# Patient Record
Sex: Male | Born: 1960 | Race: White | Hispanic: No | Marital: Married | Smoking: Former smoker
Health system: Southern US, Community
[De-identification: ages and names within clinical notes are randomized; demographics above are authoritative.]

## PROBLEM LIST (undated history)

## (undated) DIAGNOSIS — F419 Anxiety disorder, unspecified: Secondary | ICD-10-CM

## (undated) DIAGNOSIS — M65351 Trigger finger, right little finger: Secondary | ICD-10-CM

## (undated) DIAGNOSIS — F32A Depression, unspecified: Secondary | ICD-10-CM

## (undated) DIAGNOSIS — K589 Irritable bowel syndrome without diarrhea: Secondary | ICD-10-CM

## (undated) DIAGNOSIS — E785 Hyperlipidemia, unspecified: Secondary | ICD-10-CM

## (undated) DIAGNOSIS — I10 Essential (primary) hypertension: Secondary | ICD-10-CM

## (undated) DIAGNOSIS — E119 Type 2 diabetes mellitus without complications: Secondary | ICD-10-CM

## (undated) DIAGNOSIS — F1020 Alcohol dependence, uncomplicated: Secondary | ICD-10-CM

## (undated) DIAGNOSIS — F329 Major depressive disorder, single episode, unspecified: Secondary | ICD-10-CM

## (undated) DIAGNOSIS — K5792 Diverticulitis of intestine, part unspecified, without perforation or abscess without bleeding: Secondary | ICD-10-CM

## (undated) DIAGNOSIS — M65322 Trigger finger, left index finger: Secondary | ICD-10-CM

## (undated) DIAGNOSIS — T7840XA Allergy, unspecified, initial encounter: Secondary | ICD-10-CM

## (undated) DIAGNOSIS — K219 Gastro-esophageal reflux disease without esophagitis: Secondary | ICD-10-CM

## (undated) HISTORY — DX: Hyperlipidemia, unspecified: E78.5

## (undated) HISTORY — DX: Gastro-esophageal reflux disease without esophagitis: K21.9

## (undated) HISTORY — DX: Anxiety disorder, unspecified: F41.9

## (undated) HISTORY — PX: INGUINAL HERNIA REPAIR: SUR1180

## (undated) HISTORY — DX: Essential (primary) hypertension: I10

## (undated) HISTORY — DX: Allergy, unspecified, initial encounter: T78.40XA

## (undated) HISTORY — DX: Alcohol dependence, uncomplicated: F10.20

## (undated) HISTORY — DX: Type 2 diabetes mellitus without complications: E11.9

## (undated) HISTORY — DX: Major depressive disorder, single episode, unspecified: F32.9

## (undated) HISTORY — DX: Depression, unspecified: F32.A

---

## 2011-11-23 ENCOUNTER — Ambulatory Visit (INDEPENDENT_AMBULATORY_CARE_PROVIDER_SITE_OTHER): Payer: BC Managed Care – PPO | Admitting: Family Medicine

## 2011-11-23 ENCOUNTER — Encounter: Payer: Self-pay | Admitting: Family Medicine

## 2011-11-23 VITALS — BP 152/89 | HR 74 | Temp 98.6°F | Resp 16 | Ht 71.0 in | Wt 254.0 lb

## 2011-11-23 DIAGNOSIS — R030 Elevated blood-pressure reading, without diagnosis of hypertension: Secondary | ICD-10-CM

## 2011-11-23 DIAGNOSIS — E669 Obesity, unspecified: Secondary | ICD-10-CM | POA: Insufficient documentation

## 2011-11-23 DIAGNOSIS — J309 Allergic rhinitis, unspecified: Secondary | ICD-10-CM | POA: Insufficient documentation

## 2011-11-23 NOTE — Progress Notes (Signed)
  Subjective:    Patient ID: Kyle Price, male    DOB: [birth year], 51 y.o.   MRN: [id number]  HPI  This 52 y.o. 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 His only complaint is an mild HA in the afternoon.  He has a Strong Family Hx for CAD; both his father and his brother had Myocardial Infarctions.  He and his wife had made some lifestyle changes in order to lose weight and to help him reduce his  risk factors for CAD.   Review of Systems  Constitutional: Negative.   Respiratory: Negative.   Cardiovascular: Negative.   Neurological: Positive for headaches. Negative for dizziness, syncope, weakness, light-headedness and numbness.  Psychiatric/Behavioral: Negative.        Objective:   Physical Exam  Nursing note and vitals reviewed. Constitutional: He is oriented to person, place, and time. He appears well-developed and well-nourished. No distress.  HENT:  Head: Normocephalic and atraumatic.  Eyes: EOM are normal. No scleral icterus.  Cardiovascular: Normal rate.   Pulmonary/Chest: Effort normal. No respiratory distress.  Musculoskeletal: He exhibits no edema.  Neurological: He is alert and oriented to person, place, and time. No cranial nerve deficit.  Psychiatric: He has a normal mood and affect. His behavior is normal.          Assessment & Plan:   1. Elevated blood-pressure reading without diagnosis of hypertension     ContinueTLCs for weight reduction and RTC in 1 month   2. Obesity (BMI 30-39.9)

## 2011-11-23 NOTE — Patient Instructions (Signed)
Obesity Obesity is defined as having a body mass index (BMI) of 30 or more. To calculate your BMI divide your weight in pounds by your height in inches squared and multiply that product by 703. Major illnesses resulting from long-term obesity include:  Stroke.   Heart disease.   Diabetes.   Many cancers.   Arthritis.  Obesity also complicates recovery from many other medical problems.  CAUSES   A history of obesity in your parents.   Thyroid hormone imbalance.   Environmental factors such as excess calorie intake and physical inactivity.  TREATMENT  A healthy weight loss program includes:  A calorie restricted diet based on individual calorie needs.   Increased physical activity (exercise).  An exercise program is just as important as the right low-calorie diet.  Weight-loss medicines should be used only under the supervision of your physician. These medicines help, but only if they are used with diet and exercise programs. Medicines can have side effects including nervousness, nausea, abdominal pain, diarrhea, headache, drowsiness, and depression.  An unhealthy weight loss program includes:  Fasting.   Fad diets.   Supplements and drugs.  These choices do not succeed in long-term weight control.  HOME CARE INSTRUCTIONS  To help you make the needed dietary changes:   Exercise and perform physical activity as directed by your caregiver.   Keep a daily record of everything you eat. There are many free websites to help you with this. It may be helpful to measure your foods so you can determine if you are eating the correct portion sizes.   Use low-calorie cookbooks or take special cooking classes.   Avoid alcohol. Drink more water and drinks with no calories.   Take vitamins and supplements only as recommended by your caregiver.   Weight loss support groups, Registered Dieticians, counselors, and stress reduction education can also be very helpful.  Document Released:  10/06/2004 Document Revised: 08/18/2011 Document Reviewed: 08/05/2007 [employer] Patient Information 2012 [employer]. 

## 2012-01-04 ENCOUNTER — Encounter: Payer: Self-pay | Admitting: Family Medicine

## 2012-01-04 ENCOUNTER — Ambulatory Visit (INDEPENDENT_AMBULATORY_CARE_PROVIDER_SITE_OTHER): Payer: BC Managed Care – PPO | Admitting: Family Medicine

## 2012-01-04 VITALS — BP 149/92 | HR 86 | Temp 97.6°F | Resp 16 | Ht 70.0 in | Wt 258.0 lb

## 2012-01-04 DIAGNOSIS — Z8249 Family history of ischemic heart disease and other diseases of the circulatory system: Secondary | ICD-10-CM

## 2012-01-04 DIAGNOSIS — E669 Obesity, unspecified: Secondary | ICD-10-CM

## 2012-01-04 DIAGNOSIS — IMO0001 Reserved for inherently not codable concepts without codable children: Secondary | ICD-10-CM

## 2012-01-04 DIAGNOSIS — R030 Elevated blood-pressure reading, without diagnosis of hypertension: Secondary | ICD-10-CM

## 2012-01-04 NOTE — Progress Notes (Signed)
  Subjective:    Patient ID: Kyle Price, male    DOB: [birth year], 51 y.o.   MRN: [id number]  HPI This pleasant 51 y.o. Cauc male returns for BP check as he has had several elevated readings  as high as 180/102 back in February when he was here for DOT exam. Repeat readings are always lower.  Pt is aware that he needs to reduce his weight and had used Weight Watchers in the past. 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 This AM, he had a little stress at home  trying to get his 29 y.o.son up for school.   Review of Systems  Constitutional: Negative.   Respiratory: Positive for shortness of breath. Negative for cough and chest tightness.        Minimal SOB with activity due to "being out of shape"  Cardiovascular: Negative for chest pain and palpitations.  Neurological: Negative for dizziness, syncope, light-headedness and headaches.       Objective:   Physical Exam  Nursing note and vitals reviewed. Constitutional: He is oriented to person, place, and time. He appears well-developed and well-nourished. No distress.  HENT:  Head: Normocephalic and atraumatic.  Eyes: EOM are normal. No scleral icterus.  Cardiovascular: Normal rate, regular rhythm and normal heart sounds.  Exam reveals no gallop and no friction rub.   No murmur heard. Pulmonary/Chest: Effort normal and breath sounds normal. No respiratory distress.  Abdominal:       Obese  Neurological: He is alert and oriented to person, place, and time. No cranial nerve deficit.  Psychiatric: He has a normal mood and affect. His behavior is normal.    ECG: NSR; no ST-T wave abnormalities      Assessment & Plan:   1. HTN, white coat  Monitor and encourage healthy lifestyle changes Re-check in 6 weeks and 3 months  2. Obesity (BMI 35.0-39.9 without comorbidity)

## 2012-01-04 NOTE — Patient Instructions (Signed)
Your blood pressure was elevated today and you may have "White coat" hypertension. We will monitor your BP; return to office in 6 weeks and 3 months. Begin healthy nutrition changes and increase your activity level.

## 2012-02-15 ENCOUNTER — Ambulatory Visit: Payer: BC Managed Care – PPO | Admitting: Family Medicine

## 2012-02-24 ENCOUNTER — Ambulatory Visit: Payer: BC Managed Care – PPO | Admitting: Family Medicine

## 2012-03-02 ENCOUNTER — Ambulatory Visit: Payer: BC Managed Care – PPO | Admitting: Family Medicine

## 2012-04-10 ENCOUNTER — Ambulatory Visit (INDEPENDENT_AMBULATORY_CARE_PROVIDER_SITE_OTHER): Payer: BC Managed Care – PPO | Admitting: Family Medicine

## 2012-04-10 VITALS — BP 152/82 | HR 84 | Temp 98.7°F | Resp 16 | Ht 71.0 in | Wt 256.0 lb

## 2012-04-10 DIAGNOSIS — K589 Irritable bowel syndrome without diarrhea: Secondary | ICD-10-CM

## 2012-04-10 DIAGNOSIS — R300 Dysuria: Secondary | ICD-10-CM

## 2012-04-10 DIAGNOSIS — N390 Urinary tract infection, site not specified: Secondary | ICD-10-CM

## 2012-04-10 DIAGNOSIS — K5900 Constipation, unspecified: Secondary | ICD-10-CM

## 2012-04-10 LAB — POCT CBC
HCT, POC: 47.9 % (ref 43.5–53.7)
Hemoglobin: 15.9 g/dL (ref 14.1–18.1)
MCH, POC: 30.3 pg (ref 27–31.2)
MPV: 8.9 fL (ref 0–99.8)
POC MID %: 5.4 %M (ref 0–12)
RBC: 5.24 M/uL (ref 4.69–6.13)
WBC: 12.3 10*3/uL — AB (ref 4.6–10.2)

## 2012-04-10 LAB — POCT UA - MICROSCOPIC ONLY
Casts, Ur, LPF, POC: NEGATIVE
Epithelial cells, urine per micros: NEGATIVE
Mucus, UA: NEGATIVE

## 2012-04-10 LAB — POCT URINALYSIS DIPSTICK
Bilirubin, UA: NEGATIVE
Glucose, UA: NEGATIVE
Spec Grav, UA: 1.025
Urobilinogen, UA: 0.2

## 2012-04-10 LAB — IFOBT (OCCULT BLOOD): IFOBT: NEGATIVE

## 2012-04-10 MED ORDER — CIPROFLOXACIN HCL 500 MG PO TABS
500.0000 mg | ORAL_TABLET | Freq: Two times a day (BID) | ORAL | Status: AC
Start: 2012-04-10 — End: 2012-04-20

## 2012-04-10 NOTE — Patient Instructions (Addendum)
Urinary Tract Infection Infections of the urinary tract can start in several places. A bladder infection (cystitis), a kidney infection (pyelonephritis), and a prostate infection (prostatitis) are different types of urinary tract infections (UTIs). They usually get better if treated with medicines (antibiotics) that kill germs. Take all the medicine until it is gone. You or your child may feel better in a few days, but TAKE ALL MEDICINE or the infection may not respond and may become more difficult to treat. HOME CARE INSTRUCTIONS   Drink enough water and fluids to keep the urine clear or pale yellow. Cranberry juice is especially recommended, in addition to large amounts of water.   Avoid caffeine, tea, and carbonated beverages. They tend to irritate the bladder.   Alcohol may irritate the prostate.   Only take over-the-counter or prescription medicines for pain, discomfort, or fever as directed by your caregiver.  To prevent further infections:  Empty the bladder often. Avoid holding urine for long periods of time.   After a bowel movement, women should cleanse from front to back. Use each tissue only once.   Empty the bladder before and after sexual intercourse.  FINDING OUT THE RESULTS OF YOUR TEST Not all test results are available during your visit. If your or your child's test results are not back during the visit, make an appointment with your caregiver to find out the results. Do not assume everything is normal if you have not heard from your caregiver or the medical facility. It is important for you to follow up on all test results. SEEK MEDICAL CARE IF:   There is back pain.   Your baby is older than 3 months with a rectal temperature of 100.5 F (38.1 C) or higher for more than 1 day.   Your or your child's problems (symptoms) are no better in 3 days. Return sooner if you or your child is getting worse.  SEEK IMMEDIATE MEDICAL CARE IF:   There is severe back pain or lower  abdominal pain.   You or your child develops chills.   You have a fever.   Your baby is older than 3 months with a rectal temperature of 102 F (38.9 C) or higher.   Your baby is 50 months old or younger with a rectal temperature of 100.4 F (38 C) or higher.   There is nausea or vomiting.   There is continued burning or discomfort with urination.  MAKE SURE YOU:   Understand these instructions.   Will watch your condition.   Will get help right away if you are not doing well or get worse.  Document Released: 06/08/2005 Document Revised: 08/18/2011 Document Reviewed: 01/11/2007 [facility] Patient Information 2012 [place], [place].   Constipation in Adults Constipation is having fewer than 2 bowel movements per week. Usually, the stools are hard. As we grow older, constipation is more common. If you try to fix constipation with laxatives, the problem may get worse. This is because laxatives taken over a long period of time make the colon muscles weaker. A low-fiber diet, not taking in enough fluids, and taking some medicines may make these problems worse. MEDICATIONS THAT MAY CAUSE CONSTIPATION  Water pills (diuretics).   Calcium channel blockers (used to control blood pressure and for the heart).   Certain pain medicines (narcotics).   Anticholinergics.   Anti-inflammatory agents.   Antacids that contain aluminum.  DISEASES THAT CONTRIBUTE TO CONSTIPATION  Diabetes.   Parkinson's disease.   Dementia.   Stroke.  Depression.   Illnesses that cause problems with salt and water metabolism.  HOME CARE INSTRUCTIONS   Constipation is usually best cared for without medicines. Increasing dietary fiber and eating more fruits and vegetables is the best way to manage constipation.   Slowly increase fiber intake to 25 to 38 grams per day. Whole grains, fruits, vegetables, and legumes are good sources of fiber. A dietitian can further help you incorporate high-fiber foods  into your diet.   Drink enough water and fluids to keep your urine clear or pale yellow.   A fiber supplement may be added to your diet if you cannot get enough fiber from foods.   Increasing your activities also helps improve regularity.   Suppositories, as suggested by your caregiver, will also help. If you are using antacids, such as aluminum or calcium containing products, it will be helpful to switch to products containing magnesium if your caregiver says it is okay.   If you have been given a liquid injection (enema) today, this is only a temporary measure. It should not be relied on for treatment of longstanding (chronic) constipation.   Stronger measures, such as magnesium sulfate, should be avoided if possible. This may cause uncontrollable diarrhea. Using magnesium sulfate may not allow you time to make it to the bathroom.  SEEK IMMEDIATE MEDICAL CARE IF:   There is bright red blood in the stool.   The constipation stays for more than 4 days.   There is belly (abdominal) or rectal pain.   You do not seem to be getting better.   You have any questions or concerns.  MAKE SURE YOU:   Understand these instructions.   Will watch your condition.   Will get help right away if you are not doing well or get worse.  Document Released: 05/27/2004 Document Revised: 08/18/2011 Document Reviewed: 08/02/2011 [facility] Patient Information 2012 [place], [place].

## 2012-04-10 NOTE — Progress Notes (Signed)
Subjective: 51 year old man who has been having problems recently. Last week he had several days of pain between his scapula his upper back, then a set subsequently subsided. He has history of chronic constipation, more in the recent year. He has had some pain in the last 24 hours when he attempts to urinate. He has not had that problem in the past except for a little mild dysuria when he was young. He is generally healthy. He is sedentary.  Objective: Overweight white male no acute distress. Chest clear. Heart regular without murmurs. Abdomen soft without masses. Has some mild generalized tenderness. No CVA tenderness. Normal male external genitalia with testes descended. Digital rectal exam reveals the prostate gland and the fairly average in size with no nodules palpable.  Assessment: Dysuria Constipation Recent back pains  Plan: CBC, PSA, urinalysis, hemosure  Results for orders placed in visit on 04/10/12  POCT CBC      Component Value Range   WBC 12.3 (*) 4.6 - 10.2 K/uL   Lymph, poc 1.5  0.6 - 3.4   POC LYMPH PERCENT 12.4  10 - 50 %L   MID (cbc) 0.7  0 - 0.9   POC MID % 5.4  0 - 12 %M   POC Granulocyte 10.1 (*) 2 - 6.9   Granulocyte percent 82.2 (*) 37 - 80 %G   RBC 5.24  4.69 - 6.13 M/uL   Hemoglobin 15.9  14.1 - 18.1 g/dL   HCT, POC 19.1  47.8 - 53.7 %   MCV 91.4  80 - 97 fL   MCH, POC 30.3  27 - 31.2 pg   MCHC 33.2  31.8 - 35.4 g/dL   RDW, POC 29.5     Platelet Count, POC 262  142 - 424 K/uL   MPV 8.9  0 - 99.8 fL  POCT UA - MICROSCOPIC ONLY      Component Value Range   WBC, Ur, HPF, POC 15-25     RBC, urine, microscopic 0-1     Bacteria, U Microscopic 4+     Mucus, UA neg     Epithelial cells, urine per micros neg     Crystals, Ur, HPF, POC neg     Casts, Ur, LPF, POC neg     Yeast, UA neg    POCT URINALYSIS DIPSTICK      Component Value Range   Color, UA yellow     Clarity, UA clear     Glucose, UA neg     Bilirubin, UA neg     Ketones, UA eng     Spec  Grav, UA 1.025     Blood, UA trace-intact     pH, UA 6.5     Protein, UA neg     Urobilinogen, UA 0.2     Nitrite, UA positive     Leukocytes, UA Trace    IFOBT (OCCULT BLOOD)      Component Value Range   IFOBT Negative     Diagnoses: UTI  Plan: Antibiotics urine culture and return in all worse  Advise complete physical this fall

## 2012-04-14 LAB — URINE CULTURE

## 2012-04-16 ENCOUNTER — Telehealth: Payer: Self-pay

## 2012-04-16 NOTE — Telephone Encounter (Signed)
Called patient back he has 2 more pills left (ipro for UTI) fever gone is but he is still having burning upon urination. He is asking if more antibiotics would be helpful with this. Culture does show sensitivity to Cipro. Please advise next step and I will call patient to advise.

## 2012-04-16 NOTE — Telephone Encounter (Signed)
Pt in office for uti was given rx for antibiotics and he will be taking last pill tomorrow, he is still feeling some discomfort and would like for a nurse or Dr to contact him concerning this, he also feels another round of antibiotics would clear uti completeley up if rx refill is sent in [facility] on [address]

## 2012-04-16 NOTE — Telephone Encounter (Signed)
Dr. Alwyn Ren, do you want to refill or have patient RTC?

## 2012-04-16 NOTE — Telephone Encounter (Signed)
Advise return for recheck so we can decide on need for further treatment. I am here Tuesday afternoon if he can come in.

## 2012-04-17 NOTE — Telephone Encounter (Signed)
Patient advised, and he is feeling better today, he will return to the clinic if symptoms return. He was advised of culture result.

## 2012-04-23 ENCOUNTER — Ambulatory Visit (INDEPENDENT_AMBULATORY_CARE_PROVIDER_SITE_OTHER): Payer: BC Managed Care – PPO | Admitting: Emergency Medicine

## 2012-04-23 VITALS — BP 116/80 | HR 66 | Temp 98.0°F | Resp 16 | Ht 71.0 in | Wt 259.2 lb

## 2012-04-23 DIAGNOSIS — R109 Unspecified abdominal pain: Secondary | ICD-10-CM

## 2012-04-23 DIAGNOSIS — K5732 Diverticulitis of large intestine without perforation or abscess without bleeding: Secondary | ICD-10-CM

## 2012-04-23 DIAGNOSIS — K5792 Diverticulitis of intestine, part unspecified, without perforation or abscess without bleeding: Secondary | ICD-10-CM

## 2012-04-23 LAB — POCT CBC
HCT, POC: 45.2 % (ref 43.5–53.7)
Lymph, poc: 1.4 (ref 0.6–3.4)
MCHC: 31.4 g/dL — AB (ref 31.8–35.4)
MCV: 91.1 fL (ref 80–97)
POC Granulocyte: 4.7 (ref 2–6.9)
POC LYMPH PERCENT: 21.8 %L (ref 10–50)
RDW, POC: 14.2 %

## 2012-04-23 LAB — POCT URINALYSIS DIPSTICK
Ketones, UA: NEGATIVE
Protein, UA: 30
Spec Grav, UA: 1.02
pH, UA: 7

## 2012-04-23 LAB — POCT UA - MICROSCOPIC ONLY
Casts, Ur, LPF, POC: NEGATIVE
Epithelial cells, urine per micros: NEGATIVE
Mucus, UA: POSITIVE
Yeast, UA: NEGATIVE

## 2012-04-23 MED ORDER — CIPROFLOXACIN HCL 500 MG PO TABS: 500.0000 mg | ORAL_TABLET | Freq: Two times a day (BID) | ORAL | Status: AC

## 2012-04-23 MED ORDER — METRONIDAZOLE 500 MG PO TABS: 500.0000 mg | ORAL_TABLET | Freq: Four times a day (QID) | ORAL | Status: AC

## 2012-04-23 NOTE — Progress Notes (Signed)
Date:  04/23/2012   Name:  Kyle Price   DOB:  19-Jun-1961   MRN:  [id number] Gender: male  Age: 51 y.o.  PCP:  No primary provider on file.    Chief Complaint: Abdominal Pain   History of Present Illness:  Kyle Price is a 51 y.o. pleasant patient who presents with the following:  Developed pain in lower abdomen Saturday night and was not really remarkable and went mostly away during the day.  Awoke at 0400 today with intense lower left abdomen.  Treated for UTI with cipro a couple weeks ago.  No fever, chills, nausea or vomiting.  One loose stool.  No blood mucous or pus in stool.  Some pain when walking.  No surgical history.  Denies GU symptoms  Patient Active Problem List  Diagnosis  . Elevated blood-pressure reading without diagnosis of hypertension  . Obesity (BMI 30-39.9)  . Allergic rhinitis    No past medical history on file.  No past surgical history on file.  History  Substance Use Topics  . Smoking status: Former Games developer  . Smokeless tobacco: Not on file  . Alcohol Use: No    Family History  Problem Relation Age of Onset  . Heart disease Father   . Heart disease Brother     Allergies  Allergen Reactions  . Sulfa Antibiotics     rash    Medication list has been reviewed and updated.  Current Outpatient Prescriptions on File Prior to Visit  Medication Sig Dispense Refill  . HAWTHORN EXTRACT PO Take by mouth as needed.      . cetirizine (ZYRTEC) 10 MG tablet Take 10 mg by mouth daily.      . clindamycin (CLEOCIN) 300 MG capsule Take 300 mg by mouth 2 (two) times daily.        Review of Systems:  As per HPI, otherwise negative.    Physical Examination: Filed Vitals:   04/23/12 0900  BP: 116/80  Pulse: 66  Temp: 98 F (36.7 C)  Resp: 16   Filed Vitals:   04/23/12 0900  Height: 5\' 11"  (1.803 m)  Weight: 259 lb 3.2 oz (117.572 kg)   Body mass index is 36.15 kg/(m^2). Ideal Body Weight: Weight in (lb) to have BMI = 25: 178.9    GEN: WDWN, NAD, Non-toxic, A & O x 3 HEENT: Atraumatic, Normocephalic. Neck supple. No masses, No LAD. Ears and Nose: No external deformity. CV: RRR, No M/G/R. No JVD. No thrill. No extra heart sounds. PULM: CTA B, no wheezes, crackles, rhonchi. No retractions. No resp. distress. No accessory muscle use. ABD: S, tender in LLQ, ND, +BS. No rebound. No HSM. EXTR: No c/c/e NEURO Normal gait.  PSYCH: Normally interactive. Conversant. Not depressed or anxious appearing.  Calm demeanor.    Assessment and Plan: Diverticulitis cipro Flagyl Follow up for fever, vomiting, increased pain, failure to improve  Carmelina Dane, MD  Results for orders placed in visit on 04/23/12  POCT CBC      Component Value Range   WBC 6.4  4.6 - 10.2 K/uL   Lymph, poc 1.4  0.6 - 3.4   POC LYMPH PERCENT 21.8  10 - 50 %L   MID (cbc) 0.3  0 - 0.9   POC MID % 4.8  0 - 12 %M   POC Granulocyte 4.7  2 - 6.9   Granulocyte percent 73.4  37 - 80 %G   RBC 4.96  4.69 - 6.13 M/uL  Hemoglobin 14.2  14.1 - 18.1 g/dL   HCT, POC 96.0  45.4 - 53.7 %   MCV 91.1  80 - 97 fL   MCH, POC 28.6  27 - 31.2 pg   MCHC 31.4 (*) 31.8 - 35.4 g/dL   RDW, POC 09.8     Platelet Count, POC 270  142 - 424 K/uL   MPV 9.1  0 - 99.8 fL  POCT URINALYSIS DIPSTICK      Component Value Range   Color, UA yellow     Clarity, UA clear     Glucose, UA neg     Bilirubin, UA neg     Ketones, UA neg     Spec Grav, UA 1.020     Blood, UA trace-intact     pH, UA 7.0     Protein, UA 30     Urobilinogen, UA 0.2     Nitrite, UA neg     Leukocytes, UA Negative    POCT UA - MICROSCOPIC ONLY      Component Value Range   WBC, Ur, HPF, POC 0-2     RBC, urine, microscopic 1-5     Bacteria, U Microscopic neg     Mucus, UA positive     Epithelial cells, urine per micros neg     Crystals, Ur, HPF, POC neg     Casts, Ur, LPF, POC neg     Yeast, UA neg

## 2012-04-23 NOTE — Patient Instructions (Signed)
diverDiverticulitis A diverticulum is a small pouch or sac on the colon. Diverticulosis is the presence of these diverticula on the colon. Diverticulitis is the irritation (inflammation) or infection of diverticula. CAUSES  The colon and its diverticula contain bacteria. If food particles block the tiny opening to a diverticulum, the bacteria inside can grow and cause an increase in pressure. This leads to infection and inflammation and is called diverticulitis. SYMPTOMS   Abdominal pain and tenderness. Usually, the pain is located on the left side of your abdomen. However, it could be located elsewhere.   Fever.   Bloating.   Feeling sick to your stomach (nausea).   Throwing up (vomiting).   Abnormal stools.  DIAGNOSIS  Your caregiver will take a history and perform a physical exam. Since many things can cause abdominal pain, other tests may be necessary. Tests may include:  Blood tests.   Urine tests.   X-ray of the abdomen.   CT scan of the abdomen.  Sometimes, surgery is needed to determine if diverticulitis or other conditions are causing your symptoms. TREATMENT  Most of the time, you can be treated without surgery. Treatment includes:  Resting the bowels by only having liquids for a few days. As you improve, you will need to eat a low-fiber diet.   Intravenous (IV) fluids if you are losing body fluids (dehydrated).   Antibiotic medicines that treat infections may be given.   Pain and nausea medicine, if needed.   Surgery if the inflamed diverticulum has burst.  HOME CARE INSTRUCTIONS   Try a clear liquid diet (broth, tea, or water for as long as directed by your caregiver). You may then gradually begin a low-fiber diet as tolerated. A low-fiber diet is a diet with less than 10 grams of fiber. Choose the foods below to reduce fiber in the diet:   White breads, cereals, rice, and pasta.   Cooked fruits and vegetables or soft fresh fruits and vegetables without the  skin.   Ground or well-cooked tender beef, ham, veal, lamb, pork, or poultry.   Eggs and seafood.   After your diverticulitis symptoms have improved, your caregiver may put you on a high-fiber diet. A high-fiber diet includes 14 grams of fiber for every 1000 calories consumed. For a standard 2000 calorie diet, you would need 28 grams of fiber. Follow these diet guidelines to help you increase the fiber in your diet. It is important to slowly increase the amount fiber in your diet to avoid gas, constipation, and bloating.   Choose whole-grain breads, cereals, pasta, and brown rice.   Choose fresh fruits and vegetables with the skin on. Do not overcook vegetables because the more vegetables are cooked, the more fiber is lost.   Choose more nuts, seeds, legumes, dried peas, beans, and lentils.   Look for food products that have greater than 3 grams of fiber per serving on the Nutrition Facts label.   Take all medicine as directed by your caregiver.   If your caregiver has given you a follow-up appointment, it is very important that you go. Not going could result in lasting (chronic) or permanent injury, pain, and disability. If there is any problem keeping the appointment, call to reschedule.  SEEK MEDICAL CARE IF:   Your pain does not improve.   You have a hard time advancing your diet beyond clear liquids.   Your bowel movements do not return to normal.  SEEK IMMEDIATE MEDICAL CARE IF:   Your pain becomes worse.  You have an oral temperature above 102 F (38.9 C), not controlled by medicine.   You have repeated vomiting.   You have bloody or black, tarry stools.   Symptoms that brought you to your caregiver become worse or are not getting better.  MAKE SURE YOU:   Understand these instructions.   Will watch your condition.   Will get help right away if you are not doing well or get worse.  Document Released: 06/08/2005 Document Revised: 08/18/2011 Document Reviewed:  10/04/2010 [facility] Patient Information 2012 [place], [place].

## 2012-04-26 ENCOUNTER — Telehealth: Payer: Self-pay

## 2012-04-26 NOTE — Telephone Encounter (Signed)
D/C Cipro. If abdominal pain returns needs to RTC for evaluation

## 2012-04-26 NOTE — Telephone Encounter (Signed)
I spoke to him and he increased his fluids last pm and is feeling a lot better, his abdominal pain is much better, he just has headache and dizziness shortly after taking the Cipro. Please advise.

## 2012-04-26 NOTE — Telephone Encounter (Signed)
Left message for him to call back.  

## 2012-04-26 NOTE — Telephone Encounter (Signed)
He needs to increase fluids to help with possible dehydration. How is his abdominal pain?

## 2012-04-26 NOTE — Telephone Encounter (Signed)
Patient advised. He will let us know or come in if he gets worse.

## 2012-04-26 NOTE — Telephone Encounter (Signed)
The patient called regarding side effects from Cipro rx given to patient on 04/23/12 for diverticulosis.  The patient stated he is having severe headache and dizziness every time he takes the Cipro.  Please call the patient at [phone].  The patient stated that he called the [employer] help line last night and found out he was very dehydrated- he believes that this was caused by the medications as well.

## 2012-08-22 ENCOUNTER — Ambulatory Visit: Payer: BC Managed Care – PPO | Admitting: Family Medicine

## 2013-02-28 ENCOUNTER — Ambulatory Visit (INDEPENDENT_AMBULATORY_CARE_PROVIDER_SITE_OTHER): Payer: BC Managed Care – PPO | Admitting: Family Medicine

## 2013-02-28 ENCOUNTER — Encounter: Payer: Self-pay | Admitting: Family Medicine

## 2013-02-28 VITALS — BP 160/96 | HR 81 | Temp 98.8°F | Resp 16 | Ht 71.5 in | Wt 271.0 lb

## 2013-02-28 DIAGNOSIS — R030 Elevated blood-pressure reading, without diagnosis of hypertension: Secondary | ICD-10-CM

## 2013-02-28 DIAGNOSIS — E669 Obesity, unspecified: Secondary | ICD-10-CM

## 2013-02-28 DIAGNOSIS — M6530 Trigger finger, unspecified finger: Secondary | ICD-10-CM

## 2013-02-28 DIAGNOSIS — K589 Irritable bowel syndrome without diarrhea: Secondary | ICD-10-CM

## 2013-02-28 NOTE — Progress Notes (Signed)
S:  This 52 y.o. Cauc male is here for eval of problems w/ stool irregularity. He has occasional diarrhea alternating w/ constipation. This has been a problem since age 72. He attributes this problem to poor nutrition choices; his grandmother was very controlling when it came to meal time. Once pt could control what he ate, he has tended to make unhealthy choices. His diet is not rich in fruits and vegetables and he admits he is an emotional eater. He has consumed more junk food since a friend of his died in an accident last winter. He states he had his weight down to 235 pounds but gained back what he lost. He and his wife are starting to make better lifestyle choices. He requests GI referral to get his CRS. He has hemorrhoids and notices blood on toilet paper but not in toilet. When the diagnosis of IBS was mentioned, he endorsed that he has a lot of work-related stress. He tends to internalize his stress. Regular exercise helps with stress reduction.  Elevated blood pressure today- pt rushed here from work. He has a new boss and increased stress. Away from physician's office, BP has been 120-130/70-80. Pt denies any symptoms of CP or tightness, palpitations, SOB or DOE, HA, dizziness, weakness or fatigue or syncope.  Trigger fingers- both middle fingers have been "sticking"; the R for 10 years and the L for < 6 months. The L middle finger has pain that awakens pt in the middle of the night; it is the worse of the two.  Patient Active Problem List   Diagnosis Date Noted  . Elevated blood-pressure reading without diagnosis of hypertension 11/23/2011  . Obesity (BMI 30-39.9) 11/23/2011  . Allergic rhinitis 11/23/2011    PMHx, Soc Hx and Fam Hx reviewed.  ROS: As per hPI.  O: Filed Vitals:   02/28/13 1606  BP: 160/96                           Weight up 12 pounds since Aug 2013.  Pulse: 81  Temp: 98.8 F (37.1 C)  Resp: 16   GEN: In NAD; WN,WD. HENT: [place]/AT; EOMI w/ clear conj/sclerae.  Otherwise unremarkable. COR: RRR. LUNGS: Normal resp rate and effort. ABD: Obese and slightly  Distended w/o guarding. No masses and no HSM. Mildly tender in LLQ w/o rebound. MS: Hands- middle fingers w/ "trigger" defect; no other deformity.  NEURO: A&O x 3; CNs intact. Nonfocal.  A/P: IBS (irritable bowel syndrome) - Pt encouraged to improve nutrition; given print information about "Anti-Inflammatory Diet" and Gluten sensitivity. Plan: Ambulatory referral to Gastroenterology  Trigger finger, acquired - Plan: Ambulatory referral to Hand Surgery  Elevated blood pressure reading without diagnosis of hypertension  Obesity, unspecified- Encouraged weight reduction through better nutrition and regular exercise. He has tried Weight Watchers in the past but does not do well with their program/philosophy.

## 2013-04-04 ENCOUNTER — Encounter: Payer: Self-pay | Admitting: Gastroenterology

## 2013-04-07 ENCOUNTER — Emergency Department (HOSPITAL_COMMUNITY): Payer: BC Managed Care – PPO

## 2013-04-07 ENCOUNTER — Emergency Department (HOSPITAL_COMMUNITY)
Admission: EM | Admit: 2013-04-07 | Discharge: 2013-04-08 | Disposition: A | Discharge status: Home / Self Care | Payer: BC Managed Care – PPO | Attending: Emergency Medicine | Admitting: Emergency Medicine

## 2013-04-07 ENCOUNTER — Encounter (HOSPITAL_COMMUNITY): Payer: Self-pay

## 2013-04-07 DIAGNOSIS — R61 Generalized hyperhidrosis: Secondary | ICD-10-CM | POA: Insufficient documentation

## 2013-04-07 DIAGNOSIS — F3289 Other specified depressive episodes: Secondary | ICD-10-CM | POA: Insufficient documentation

## 2013-04-07 DIAGNOSIS — Z87891 Personal history of nicotine dependence: Secondary | ICD-10-CM | POA: Insufficient documentation

## 2013-04-07 DIAGNOSIS — R002 Palpitations: Secondary | ICD-10-CM | POA: Insufficient documentation

## 2013-04-07 DIAGNOSIS — Z79899 Other long term (current) drug therapy: Secondary | ICD-10-CM | POA: Insufficient documentation

## 2013-04-07 DIAGNOSIS — K589 Irritable bowel syndrome without diarrhea: Secondary | ICD-10-CM | POA: Insufficient documentation

## 2013-04-07 DIAGNOSIS — F411 Generalized anxiety disorder: Secondary | ICD-10-CM | POA: Insufficient documentation

## 2013-04-07 DIAGNOSIS — F329 Major depressive disorder, single episode, unspecified: Secondary | ICD-10-CM | POA: Insufficient documentation

## 2013-04-07 HISTORY — DX: Trigger finger, left index finger: M65.322

## 2013-04-07 HISTORY — DX: Diverticulitis of intestine, part unspecified, without perforation or abscess without bleeding: K57.92

## 2013-04-07 HISTORY — DX: Irritable bowel syndrome, unspecified: K58.9

## 2013-04-07 HISTORY — DX: Trigger finger, right little finger: M65.351

## 2013-04-07 LAB — COMPREHENSIVE METABOLIC PANEL
ALT: 39 U/L (ref 0–53)
AST: 21 U/L (ref 0–37)
Albumin: 4.2 g/dL (ref 3.5–5.2)
Alkaline Phosphatase: 92 U/L (ref 39–117)
Calcium: 9.4 mg/dL (ref 8.4–10.5)
Potassium: 3.5 mEq/L (ref 3.5–5.1)
Sodium: 135 mEq/L (ref 135–145)
Total Protein: 7.3 g/dL (ref 6.0–8.3)

## 2013-04-07 LAB — CBC WITH DIFFERENTIAL/PLATELET
Basophils Relative: 1 % (ref 0–1)
Eosinophils Absolute: 0.2 10*3/uL (ref 0.0–0.7)
Eosinophils Relative: 2 % (ref 0–5)
HCT: 42.2 % (ref 39.0–52.0)
Hemoglobin: 15 g/dL (ref 13.0–17.0)
MCH: 29.4 pg (ref 26.0–34.0)
MCHC: 35.5 g/dL (ref 30.0–36.0)
MCV: 82.6 fL (ref 78.0–100.0)
Monocytes Absolute: 0.5 10*3/uL (ref 0.1–1.0)
Monocytes Relative: 7 % (ref 3–12)
Neutro Abs: 5.3 10*3/uL (ref 1.7–7.7)

## 2013-04-07 NOTE — ED Provider Notes (Signed)
CSN: [id number]     Arrival date & time 04/07/13  2011 History     First MD Initiated Contact with Patient 04/07/13 2150     Chief Complaint  Patient presents with  . Chest Pain   (Consider location/radiation/quality/duration/timing/severity/associated sxs/prior Treatment) HPI Comments: Patient presents emergency department with chief complaint of chest palpitations. He states that first occurred this morning. It was worse when he bent over, and better when he stood up. He denies feeling any associated chest pain, shortness of breath, or radiating symptoms. States that he did have some associated diaphoresis. He tried taking some Prilosec, with no relief. He states that he has had some nausea and vomiting and diarrhea in the past several days, but has not had any day. He received aspirin from EMS.  The history is provided by the patient. No language interpreter was used.    Past Medical History  Diagnosis Date  . Allergy   . Depression   . Anxiety   . IBS (irritable bowel syndrome)   . Trigger index finger of left hand   . Trigger little finger of right hand   . Diverticulitis    Past Surgical History  Procedure Laterality Date  . Hernia repair     Family History  Problem Relation Age of Onset  . Heart disease Father   . Cancer Father   . Heart disease Brother   . Diabetes Brother   . Cancer Mother   . Heart disease Maternal Grandmother   . Heart disease Maternal Grandfather   . Cancer Paternal Grandfather    History  Substance Use Topics  . Smoking status: Former Games developer  . Smokeless tobacco: Not on file  . Alcohol Use: No    Review of Systems  All other systems reviewed and are negative.    Allergies  Sulfa antibiotics and Pork-derived products  Home Medications   Current Outpatient Rx  Name  Route  Sig  Dispense  Refill  . bismuth subsalicylate (PEPTO BISMOL) 262 MG chewable tablet   Oral   Chew 524 mg by mouth as needed for indigestion.         .  fexofenadine (ALLEGRA) 60 MG tablet   Oral   Take 60 mg by mouth daily.         Marland Kitchen ibuprofen (ADVIL,MOTRIN) 200 MG tablet   Oral   Take 400 mg by mouth every 6 (six) hours as needed for pain.         Marland Kitchen omeprazole (PRILOSEC) 20 MG capsule   Oral   Take 20 mg by mouth daily.          BP 160/95  Pulse 74  Temp(Src) 98.4 F (36.9 C)  Resp 10  SpO2 89% Physical Exam  Nursing note and vitals reviewed. Constitutional: He is oriented to person, place, and time. He appears well-developed and well-nourished.  HENT:  Head: Normocephalic and atraumatic.  Eyes: Conjunctivae and EOM are normal. Pupils are equal, round, and reactive to light. Right eye exhibits no discharge. Left eye exhibits no discharge. No scleral icterus.  Neck: Normal range of motion. Neck supple. No JVD present.  Cardiovascular: Normal rate, regular rhythm, normal heart sounds and intact distal pulses.  Exam reveals no gallop and no friction rub.   No murmur heard. Pulmonary/Chest: Effort normal and breath sounds normal. No respiratory distress. He has no wheezes. He has no rales. He exhibits no tenderness.  Abdominal: Soft. Bowel sounds are normal. He exhibits no distension and no  mass. There is no tenderness. There is no rebound and no guarding.  Musculoskeletal: Normal range of motion. He exhibits no edema and no tenderness.  Neurological: He is alert and oriented to person, place, and time. He has normal reflexes.  CN 3-12 intact  Skin: Skin is warm and dry.  Psychiatric: He has a normal mood and affect. His behavior is normal. Judgment and thought content normal.    ED Course   Procedures (including critical care time)  Labs Reviewed  COMPREHENSIVE METABOLIC PANEL  CBC WITH DIFFERENTIAL  TROPONIN I  ED ECG REPORT  I personally interpreted this EKG   Date: 04/08/2013   Rate: 83  Rhythm: normal sinus rhythm  QRS Axis: normal  Intervals: normal  ST/T Wave abnormalities: normal  Conduction  Disutrbances:none  Narrative Interpretation:   Old EKG Reviewed: unchanged   Results for orders placed during the hospital encounter of 04/07/13  COMPREHENSIVE METABOLIC PANEL      Result Value Range   Sodium 135  135 - 145 mEq/L   Potassium 3.5  3.5 - 5.1 mEq/L   Chloride 98  96 - 112 mEq/L   CO2 27  19 - 32 mEq/L   Glucose, Bld 98  70 - 99 mg/dL   BUN 13  6 - 23 mg/dL   Creatinine, Ser 1.88  0.50 - 1.35 mg/dL   Calcium 9.4  8.4 - 41.6 mg/dL   Total Protein 7.3  6.0 - 8.3 g/dL   Albumin 4.2  3.5 - 5.2 g/dL   AST 21  0 - 37 U/L   ALT 39  0 - 53 U/L   Alkaline Phosphatase 92  39 - 117 U/L   Total Bilirubin 0.5  0.3 - 1.2 mg/dL   GFR calc non Af Amer 78 (*) >90 mL/min   GFR calc Af Amer >90  >90 mL/min  CBC WITH DIFFERENTIAL      Result Value Range   WBC 7.8  4.0 - 10.5 K/uL   RBC 5.11  4.22 - 5.81 MIL/uL   Hemoglobin 15.0  13.0 - 17.0 g/dL   HCT 60.6  30.1 - 60.1 %   MCV 82.6  78.0 - 100.0 fL   MCH 29.4  26.0 - 34.0 pg   MCHC 35.5  30.0 - 36.0 g/dL   RDW 09.3  23.5 - 57.3 %   Platelets 229  150 - 400 K/uL   Neutrophils Relative % 68  43 - 77 %   Neutro Abs 5.3  1.7 - 7.7 K/uL   Lymphocytes Relative 23  12 - 46 %   Lymphs Abs 1.8  0.7 - 4.0 K/uL   Monocytes Relative 7  3 - 12 %   Monocytes Absolute 0.5  0.1 - 1.0 K/uL   Eosinophils Relative 2  0 - 5 %   Eosinophils Absolute 0.2  0.0 - 0.7 K/uL   Basophils Relative 1  0 - 1 %   Basophils Absolute 0.0  0.0 - 0.1 K/uL  TROPONIN I      Result Value Range   Troponin I <0.30  <0.30 ng/mL  TROPONIN I      Result Value Range   Troponin I <0.30  <0.30 ng/mL   Dg Chest 2 View  04/07/2013   *RADIOLOGY REPORT*  Clinical Data:  Shortness of breath and heart murmur.  CHEST - 2 VIEW  Comparison: None  Findings: The heart size and mediastinal contours are within normal limits.  Both lungs are clear.  The visualized skeletal structures are unremarkable.  IMPRESSION: No active disease.   Original Report Authenticated By: Irish Lack, M.D.     1. Palpitations     MDM  Patient with chest palpitations, but no chest pain, or shortness of breath, will check basic labs, and will reevaluate. Is currently asymptomatic. He has received aspirin.  12:55 AM Patient's second troponin is negative.  Discussed the patient with Dr. Rhunette Croft.  Will discharge the patient to home with cardiology follow-up.  Patient understands and agrees with the plan.  He is stable and ready for discharge.  He is chest pain free in the Ed.  No EKG changes.  2 negative troponins.  Roxy Horseman, PA-C 04/08/13 0116

## 2013-04-07 NOTE — ED Notes (Addendum)
2010  Pt arrives to the ER via EMS with chest fluttering that started this morning at 0800 and has not dissipated all day so he wanted to come in and get checked out.  Pt states it may be due to anxiety.  Pt is A&O.  Pt also states he notices it more during activity.  Was stung by a yellow jacket this past Friday and has also been having bouts with loose stool  2100  No change in status at this time and will continue to monitor  2200  Pt ambulatory to the restroom with steady gait  2335  Removed the pts IV as it was freaking him out.  DR notified and is ok to remove.  Pt is feeliong ok at this time will continue to monitor

## 2013-04-08 NOTE — ED Provider Notes (Signed)
Medical screening examination/treatment/procedure(s) were performed by non-physician practitioner and as supervising physician I was immediately available for consultation/collaboration.  Kaisei Gilbo, MD 04/08/13 1828 

## 2013-04-11 ENCOUNTER — Encounter: Payer: Self-pay | Admitting: Family Medicine

## 2013-04-11 ENCOUNTER — Ambulatory Visit (INDEPENDENT_AMBULATORY_CARE_PROVIDER_SITE_OTHER): Payer: BC Managed Care – PPO | Admitting: Family Medicine

## 2013-04-11 VITALS — BP 168/90 | HR 78 | Temp 98.4°F | Resp 16 | Ht 70.0 in | Wt 269.9 lb

## 2013-04-11 DIAGNOSIS — I10 Essential (primary) hypertension: Secondary | ICD-10-CM

## 2013-04-11 DIAGNOSIS — E669 Obesity, unspecified: Secondary | ICD-10-CM

## 2013-04-11 MED ORDER — LISINOPRIL-HYDROCHLOROTHIAZIDE 10-12.5 MG PO TABS: 1.0000 | ORAL_TABLET | Freq: Every day | ORAL | Status: DC

## 2013-04-11 NOTE — Progress Notes (Signed)
S:  This 52 y.o. Cauc male has HTN; he was symptomatic w/ palpitations and CP 4 days ago. SBP > 200 initially when evaluated by EMS; transport to ED w/ administration of ASA. ED eval essentially negative (Troponin and ECG). Pt has since been symptom-free and has instituted lifestyle changes, namely nutrition and exercise. He has a very stressful job and is requesting BP medication until he is able to reduce weight. He has eliminated excess sugar and junk food; he has normal salt intake. Pt does report hx of anti-hypertensive medication treatment years ago.  Patient Active Problem List   Diagnosis Date Noted  . Elevated blood-pressure reading without diagnosis of hypertension 11/23/2011  . Obesity (BMI 30-39.9) 11/23/2011  . Allergic rhinitis 11/23/2011    PMHx, Soc Hx and Fam Hx reviewed; Fam Hx + for Heart Disease.  ROS: As per HPI; negative for diaphoresis, fatigue, CP or tightness, palpitations, SOB or DOE, edema, GI problems, HA, dizziness, weakness, numbness or syncope.  O:  Filed Vitals:   04/11/13 0854  BP: 168/90  Pulse: 78  Temp: 98.4 F (36.9 C)  Resp: 16   GEN: in NAD; WN,WD. Pt is obese. HENT: [place]?AT; EOMI w/ clear conj/ sclerae. EACs/nose/oroph unremarkable. COR: RRR. Normal S1,S2. No m/g/r.  LUNGS: CTA; Distant BS but no wheezes or rales. ABD; Obese, nontender. SKIN: W&D. No pallor. NEURO: A&O x 3; CNs intact. Nonfocal.  Results for orders placed during the hospital encounter of 04/07/13  COMPREHENSIVE METABOLIC PANEL      Result Value Range   Sodium 135  135 - 145 mEq/L   Potassium 3.5  3.5 - 5.1 mEq/L   Chloride 98  96 - 112 mEq/L   CO2 27  19 - 32 mEq/L   Glucose, Bld 98  70 - 99 mg/dL   BUN 13  6 - 23 mg/dL   Creatinine, Ser 0.45  0.50 - 1.35 mg/dL   Calcium 9.4  8.4 - 40.9 mg/dL   Total Protein 7.3  6.0 - 8.3 g/dL   Albumin 4.2  3.5 - 5.2 g/dL   AST 21  0 - 37 U/L   ALT 39  0 - 53 U/L   Alkaline Phosphatase 92  39 - 117 U/L   Total Bilirubin 0.5  0.3 -  1.2 mg/dL   GFR calc non Af Amer 78 (*) >90 mL/min   GFR calc Af Amer >90  >90 mL/min  CBC WITH DIFFERENTIAL      Result Value Range   WBC 7.8  4.0 - 10.5 K/uL   RBC 5.11  4.22 - 5.81 MIL/uL   Hemoglobin 15.0  13.0 - 17.0 g/dL   HCT 81.1  91.4 - 78.2 %   MCV 82.6  78.0 - 100.0 fL   MCH 29.4  26.0 - 34.0 pg   MCHC 35.5  30.0 - 36.0 g/dL   RDW 95.6  21.3 - 08.6 %   Platelets 229  150 - 400 K/uL   Neutrophils Relative % 68  43 - 77 %   Neutro Abs 5.3  1.7 - 7.7 K/uL   Lymphocytes Relative 23  12 - 46 %   Lymphs Abs 1.8  0.7 - 4.0 K/uL   Monocytes Relative 7  3 - 12 %   Monocytes Absolute 0.5  0.1 - 1.0 K/uL   Eosinophils Relative 2  0 - 5 %   Eosinophils Absolute 0.2  0.0 - 0.7 K/uL   Basophils Relative 1  0 - 1 %  Basophils Absolute 0.0  0.0 - 0.1 K/uL  TROPONIN I      Result Value Range   Troponin I <0.30  <0.30 ng/mL  TROPONIN I      Result Value Range   Troponin I <0.30  <0.30 ng/mL    A/P: HTN (hypertension)- RX: Lisinopril- HCT 10- 12.5 mg 1 tab daily. Manage job-related stress.  Obesity, unspecified- Encourage TLCs w/ consistent weight loss and good nutrition.  RTC in 2 months.  Meds ordered this encounter  Medications  . lisinopril-hydrochlorothiazide (PRINZIDE,ZESTORETIC) 10-12.5 MG per tablet    Sig: Take 1 tablet by mouth daily.    Dispense:  90 tablet    Refill:  3

## 2013-04-11 NOTE — Patient Instructions (Addendum)
Exercise to Lose Weight Exercise and a healthy diet may help you lose weight. Your doctor may suggest specific exercises. EXERCISE IDEAS AND TIPS  Choose low-cost things you enjoy doing, such as walking, bicycling, or exercising to workout videos.  Take stairs instead of the elevator.  Walk during your lunch break.  Park your car further away from work or school.  Go to a gym or an exercise class.  Start with 5 to 10 minutes of exercise each day. Build up to 30 minutes of exercise 4 to 6 days a week.  Wear shoes with good support and comfortable clothes.  Stretch before and after working out.  Work out until you breathe harder and your heart beats faster.  Drink extra water when you exercise.  Do not do so much that you hurt yourself, feel dizzy, or get very short of breath. Exercises that burn about 150 calories:  Running 1  miles in 15 minutes.  Playing volleyball for 45 to 60 minutes.  Washing and waxing a car for 45 to 60 minutes.  Playing touch football for 45 minutes.  Walking 1  miles in 35 minutes.  Pushing a stroller 1  miles in 30 minutes.  Playing basketball for 30 minutes.  Raking leaves for 30 minutes.  Bicycling 5 miles in 30 minutes.  Walking 2 miles in 30 minutes.  Dancing for 30 minutes.  Shoveling snow for 15 minutes.  Swimming laps for 20 minutes.  Walking up stairs for 15 minutes.  Bicycling 4 miles in 15 minutes.  Gardening for 30 to 45 minutes.  Jumping rope for 15 minutes.  Washing windows or floors for 45 to 60 minutes. Document Released: 10/01/2010 Document Revised: 11/21/2011 Document Reviewed: 10/01/2010 [facility] Patient Information 2014 [place], [place].    Obesity Obesity is defined as having too much total body fat and a body mass index (BMI) of 30 or more. BMI is an estimate of body fat and is calculated from your height and weight. Obesity happens when you consume more calories than you can burn by exercising or  performing daily physical tasks. Prolonged obesity can cause major illnesses or emergencies, such as:   A stroke.  Heart disease.  Diabetes.  Cancer.  Arthritis.  High blood pressure (hypertension).  High cholesterol.  Sleep apnea.  Erectile dysfunction.  Infertility problems. CAUSES   Regularly eating unhealthy foods.  Physical inactivity.  Certain disorders, such as an underactive thyroid (hypothyroidism), Cushing's syndrome, and polycystic ovarian syndrome.  Certain medicines, such as steroids, some depression medicines, and antipsychotics.  Genetics.  Lack of sleep. DIAGNOSIS  A caregiver can diagnose obesity after calculating your BMI. Obesity will be diagnosed if your BMI is 30 or higher.  There are other methods of measuring obesity levels. Some other methods include measuring your skin fold thickness, your waist circumference, and comparing your hip circumference to your waist circumference. TREATMENT  A healthy treatment program includes some or all of the following:  Long-term dietary changes.  Exercise and physical activity.  Behavioral and lifestyle changes.  Medicine only under the supervision of your caregiver. Medicines may help, but only if they are used with diet and exercise programs. An unhealthy treatment program includes:  Fasting.  Fad diets.  Supplements and drugs. These choices do not succeed in long-term weight control.  HOME CARE INSTRUCTIONS   Exercise and perform physical activity as directed by your caregiver. To increase physical activity, try the following:  Use stairs instead of elevators.  Park farther away  from store entrances.  Garden, bike, or walk instead of watching television or using the computer.  Eat healthy, low-calorie foods and drinks on a regular basis. Eat more fruits and vegetables. Use low-calorie cookbooks or take healthy cooking classes.  Limit fast food, sweets, and processed snack foods.  Eat  smaller portions.  Keep a daily journal of everything you eat. There are many free websites to help you with this. It may be helpful to measure your foods so you can determine if you are eating the correct portion sizes.  Avoid drinking alcohol. Drink more water and drinks without calories.  Take vitamins and supplements only as recommended by your caregiver.  Weight-loss support groups, Optometrist, counselors, and stress reduction education can also be very helpful. SEEK IMMEDIATE MEDICAL CARE IF:  You have chest pain or tightness.  You have trouble breathing or feel short of breath.  You have weakness or leg numbness.  You feel confused or have trouble talking.  You have sudden changes in your vision. MAKE SURE YOU:  Understand these instructions.  Will watch your condition.  Will get help right away if you are not doing well or get worse. Document Released: 10/06/2004 Document Revised: 02/28/2012 Document Reviewed: 10/05/2011 [facility] Patient Information 2014 [place], [place].

## 2013-04-22 ENCOUNTER — Telehealth: Payer: Self-pay

## 2013-04-22 DIAGNOSIS — I10 Essential (primary) hypertension: Secondary | ICD-10-CM

## 2013-04-22 NOTE — Telephone Encounter (Signed)
Patient c/o arms cramping and ribs cramping with the medication/ lisinopril / hctz, please advise. He feels better, has not checked his blood pressure

## 2013-04-22 NOTE — Telephone Encounter (Signed)
PT WOULD LIKE TO SPEAK WITH SOMEONE ABOUT THE SIDE EFFECT OF THE MEDICINE HE WAS GIVEN PLEASE CALL [phone]

## 2013-04-23 ENCOUNTER — Other Ambulatory Visit (INDEPENDENT_AMBULATORY_CARE_PROVIDER_SITE_OTHER): Payer: BC Managed Care – PPO | Admitting: *Deleted

## 2013-04-23 DIAGNOSIS — I10 Essential (primary) hypertension: Secondary | ICD-10-CM

## 2013-04-23 LAB — BASIC METABOLIC PANEL
BUN: 16 mg/dL (ref 6–23)
CO2: 27 mEq/L (ref 19–32)
Calcium: 10 mg/dL (ref 8.4–10.5)
Glucose, Bld: 129 mg/dL — ABNORMAL HIGH (ref 70–99)
Sodium: 135 mEq/L (ref 135–145)

## 2013-04-23 NOTE — Telephone Encounter (Signed)
Thanks. Patient will come in today.

## 2013-04-23 NOTE — Progress Notes (Signed)
Patient here for Lab work only .

## 2013-04-23 NOTE — Telephone Encounter (Signed)
Is the lisinopril/HCTZ a new medication for him?  HCTZ can cause electrolyte abnormalities that could lead to muscle cramping.  Let's have him come in for a lab draw only to check electrolytes.  I will put an order in for this.

## 2013-05-15 ENCOUNTER — Ambulatory Visit (INDEPENDENT_AMBULATORY_CARE_PROVIDER_SITE_OTHER): Payer: BC Managed Care – PPO | Admitting: Gastroenterology

## 2013-05-15 ENCOUNTER — Encounter: Payer: Self-pay | Admitting: Gastroenterology

## 2013-05-15 VITALS — BP 148/90 | HR 80 | Ht 70.0 in | Wt 266.4 lb

## 2013-05-15 DIAGNOSIS — Z1211 Encounter for screening for malignant neoplasm of colon: Secondary | ICD-10-CM

## 2013-05-15 DIAGNOSIS — K589 Irritable bowel syndrome without diarrhea: Secondary | ICD-10-CM

## 2013-05-15 MED ORDER — MOVIPREP 100 G PO SOLR: 1.0000 | Freq: Once | ORAL | Status: DC

## 2013-05-15 NOTE — Patient Instructions (Addendum)
You will be set up for a colonoscopy (LEC, moderate sedation). Please start taking citrucel (orange flavored) powder fiber supplement.  This may cause some bloating at first but that usually goes away. Begin with a small spoonful and work your way up to a large, heaping spoonful daily over a week.                                              We are excited to introduce MyChart, a new best-in-class service that provides you online access to important information in your electronic medical record. We want to make it easier for you to view your health information - all in one secure location - when and where you need it. We expect MyChart will enhance the quality of care and service we provide.  When you register for MyChart, you can:    View your test results.    Request appointments and receive appointment reminders via email.    Request medication renewals.    View your medical history, allergies, medications and immunizations.    Communicate with your physician's office through a password-protected site.    Conveniently print information such as your medication lists.  To find out if MyChart is right for you, please talk to a member of our clinical staff today. We will gladly answer your questions about this free health and wellness tool.  If you are age 51 or older and want a member of your family to have access to your record, you must provide written consent by completing a proxy form available at our office. Please speak to our clinical staff about guidelines regarding accounts for patients younger than age 1.  As you activate your MyChart account and need any technical assistance, please call the MyChart technical support line at [phone]) or email your question to [email or web address]. If you email your question(s), please include your name, a return phone number and the best time to reach you.  If you have non-urgent health-related questions, you can send a  message to our office through MyChart at [email or web address]. If you have a medical emergency, call 911.  Thank you for using MyChart as your new health and wellness resource!   MyChart licensed from [employer],  [id number]. Patents Pending.

## 2013-05-15 NOTE — Progress Notes (Signed)
HPI: This is a  very pleasant 52 year old man whom I am meeting for the first time today.    Wonders about IBS.  A lot noise in abdomen.  Can have constipation, alternating with diarrhea.  Feels cramps, nausea at times.  This has been his whole life.  Feels could be stress related.    Weeklong trends of alternating.  Fiber causes terrible diarrhea; such as cereal bars  Never tried fiber supplements.  Never had colonoscopy before.    Has thin ribbons of stool with a lot of straining.  Had diverticulitis, went on abx.  Did not have CT scan at the time.  Intentional weight loss, 6 pounds in about 6 months.   Review of systems: Pertinent positive and negative review of systems were noted in the above HPI section. Complete review of systems was performed and was otherwise normal.    Past Medical History  Diagnosis Date  . Allergy   . Depression   . Anxiety   . IBS (irritable bowel syndrome)   . Trigger index finger of left hand   . Trigger little finger of right hand   . Diverticulitis     Past Surgical History  Procedure Laterality Date  . Hernia repair      Current Outpatient Prescriptions  Medication Sig Dispense Refill  . fexofenadine (ALLEGRA) 60 MG tablet Take 60 mg by mouth daily.      Marland Kitchen ibuprofen (ADVIL,MOTRIN) 200 MG tablet Take 400 mg by mouth every 6 (six) hours as needed for pain.      Marland Kitchen lisinopril-hydrochlorothiazide (PRINZIDE,ZESTORETIC) 10-12.5 MG per tablet Take 1 tablet by mouth daily.  90 tablet  3   No current facility-administered medications for this visit.    Allergies as of 05/15/2013 - Review Complete 05/15/2013  Allergen Reaction Noted  . Sulfa antibiotics  11/23/2011  . Pork-derived products Rash 04/07/2013    Family History  Problem Relation Age of Onset  . Heart disease Father   . Cancer Father   . Heart disease Brother   . Diabetes Brother   . Cancer Mother   . Heart disease Maternal Grandmother   . Heart disease Maternal  Grandfather   . Prostate cancer Maternal Grandfather   . Cancer Paternal Grandfather     History   Social History  . Marital Status: Married    Spouse Name: N/A    Number of Children: 1  . Years of Education: N/A   Occupational History  . Director of Technology    Social History Main Topics  . Smoking status: Former Games developer  . Smokeless tobacco: Never Used  . Alcohol Use: No  . Drug Use: No  . Sexual Activity: Yes    Birth Control/ Protection: Condom     Comment: number of sex partners in the last 12 months 1   Other Topics Concern  . Not on file   Social History Narrative  . No narrative on file       Physical Exam: BP 148/90  Pulse 80  Ht 5\' 10"  (1.778 m)  Wt 266 lb 6.4 oz (120.838 kg)  BMI 38.22 kg/m2 Constitutional: generally well-appearing Psychiatric: alert and oriented x3 Eyes: extraocular movements intact Mouth: oral pharynx moist, no lesions Neck: supple no lymphadenopathy Cardiovascular: heart regular rate and rhythm Lungs: clear to auscultation bilaterally Abdomen: soft, nontender, nondistended, no obvious ascites, no peritoneal signs, normal bowel sounds Extremities: no lower extremity edema bilaterally Skin: no lesions on visible extremities    Assessment and  plan: 52 y.o. male with  alternating irritable bowel syndrome, otherwise routine risk for colon cancer  He needs screening colonoscopy which we will proceed with that his soonest convenience.. For his pretty clear alternating irritable bowel he is going to try daily fiber supplements and titrated upwards to try to help the constipation component first as that usually help Viviann Spare bowels out pretty well. He will report on his response to this at the time of his colonoscopy. I see no reason for any further blood tests or imaging studies prior to then.

## 2013-05-17 ENCOUNTER — Ambulatory Visit: Payer: BC Managed Care – PPO | Admitting: Family Medicine

## 2013-06-04 ENCOUNTER — Ambulatory Visit (INDEPENDENT_AMBULATORY_CARE_PROVIDER_SITE_OTHER): Payer: BC Managed Care – PPO | Admitting: Family Medicine

## 2013-06-04 VITALS — BP 124/82 | HR 100 | Temp 98.5°F | Resp 17 | Ht 70.5 in | Wt 263.0 lb

## 2013-06-04 DIAGNOSIS — N390 Urinary tract infection, site not specified: Secondary | ICD-10-CM

## 2013-06-04 LAB — POCT UA - MICROSCOPIC ONLY
Casts, Ur, LPF, POC: NEGATIVE
Epithelial cells, urine per micros: NEGATIVE
Mucus, UA: NEGATIVE
Yeast, UA: NEGATIVE

## 2013-06-04 LAB — POCT URINALYSIS DIPSTICK
Blood, UA: NEGATIVE
Glucose, UA: NEGATIVE
Nitrite, UA: NEGATIVE
Urobilinogen, UA: 0.2
pH, UA: 6

## 2013-06-04 MED ORDER — CIPROFLOXACIN HCL 500 MG PO TABS: 500.0000 mg | ORAL_TABLET | Freq: Two times a day (BID) | ORAL | Status: DC

## 2013-06-04 NOTE — Progress Notes (Signed)
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  He has 48 hours of dysuria.  Also complains of abdominal fullness and constipation.  Scheduled for colonoscopy in November.   Objective:  NAD Abdomen:  Soft, nontender.  No HSM or mass Genitalia:  normal  Results for orders placed in visit on 06/04/13  POCT UA - MICROSCOPIC ONLY      Result Value Range   WBC, Ur, HPF, POC 0-1     RBC, urine, microscopic neg     Bacteria, U Microscopic neg     Mucus, UA neg     Epithelial cells, urine per micros neg     Crystals, Ur, HPF, POC neg     Casts, Ur, LPF, POC neg     Yeast, UA neg    POCT URINALYSIS DIPSTICK      Result Value Range   Color, UA yellow     Clarity, UA clear     Glucose, UA neg     Bilirubin, UA neg     Ketones, UA neg     Spec Grav, UA 1.010     Blood, UA neg     pH, UA 6.0     Protein, UA neg     Urobilinogen, UA 0.2     Nitrite, UA neg     Leukocytes, UA Negative     Assessment:  Symptoms typical for UTI although urine looks fairly clean  Plan:  Check urine culture Start Cipro Return if symptoms worsen or urine culture proves to be negative.

## 2013-06-04 NOTE — Patient Instructions (Signed)
Urinary Tract Infection  Urinary tract infections (UTIs) can develop anywhere along your urinary tract. Your urinary tract is your body's drainage system for removing wastes and extra water. Your urinary tract includes two kidneys, two ureters, a bladder, and a urethra. Your kidneys are a pair of bean-shaped organs. Each kidney is about the size of your fist. They are located below your ribs, one on each side of your spine.  CAUSES  Infections are caused by microbes, which are microscopic organisms, including fungi, viruses, and bacteria. These organisms are so small that they can only be seen through a microscope. Bacteria are the microbes that most commonly cause UTIs.  SYMPTOMS   Symptoms of UTIs may vary by age and gender of the patient and by the location of the infection. Symptoms in young women typically include a frequent and intense urge to urinate and a painful, burning feeling in the bladder or urethra during urination. Older women and men are more likely to be tired, shaky, and weak and have muscle aches and abdominal pain. A fever may mean the infection is in your kidneys. Other symptoms of a kidney infection include pain in your back or sides below the ribs, nausea, and vomiting.  DIAGNOSIS  To diagnose a UTI, your caregiver will ask you about your symptoms. Your caregiver also will ask to provide a urine sample. The urine sample will be tested for bacteria and white blood cells. White blood cells are made by your body to help fight infection.  TREATMENT   Typically, UTIs can be treated with medication. Because most UTIs are caused by a bacterial infection, they usually can be treated with the use of antibiotics. The choice of antibiotic and length of treatment depend on your symptoms and the type of bacteria causing your infection.  HOME CARE INSTRUCTIONS   If you were prescribed antibiotics, take them exactly as your caregiver instructs you. Finish the medication even if you feel better after you  have only taken some of the medication.   Drink enough water and fluids to keep your urine clear or pale yellow.   Avoid caffeine, tea, and carbonated beverages. They tend to irritate your bladder.   Empty your bladder often. Avoid holding urine for long periods of time.   Empty your bladder before and after sexual intercourse.   After a bowel movement, women should cleanse from front to back. Use each tissue only once.  SEEK MEDICAL CARE IF:    You have back pain.   You develop a fever.   Your symptoms do not begin to resolve within 3 days.  SEEK IMMEDIATE MEDICAL CARE IF:    You have severe back pain or lower abdominal pain.   You develop chills.   You have nausea or vomiting.   You have continued burning or discomfort with urination.  MAKE SURE YOU:    Understand these instructions.   Will watch your condition.   Will get help right away if you are not doing well or get worse.  Document Released: 06/08/2005 Document Revised: 02/28/2012 Document Reviewed: 10/07/2011  ExitCare Patient Information 2014 [employer].

## 2013-06-05 ENCOUNTER — Telehealth: Payer: Self-pay

## 2013-06-05 MED ORDER — LEVOFLOXACIN 500 MG PO TABS: 500.0000 mg | ORAL_TABLET | Freq: Every day | ORAL | Status: DC

## 2013-06-05 NOTE — Telephone Encounter (Signed)
Patient is taking 800 mg cipro and it is causing extreme dizziness.  This has happened to him before when taking this medication.    [phone]

## 2013-06-05 NOTE — Telephone Encounter (Signed)
Cipro 500 mg causing dizziness. Please advise on alternative. Have advised patient to d/c this medication, he is being treated for UTI, started medication. He is urged to increase his water intake. He also has sulfa allergy (rash)

## 2013-06-05 NOTE — Telephone Encounter (Signed)
Agree with rec to d/c cipro.  Will try levaquin 500mg  daily x 7 days.  Sent to pharmacy

## 2013-06-06 LAB — URINE CULTURE
Colony Count: NO GROWTH
Organism ID, Bacteria: NO GROWTH

## 2013-06-06 NOTE — Telephone Encounter (Signed)
Called him to advise. His dizziness resolved, he will let me know if he needs anything else

## 2013-06-10 ENCOUNTER — Telehealth: Payer: Self-pay

## 2013-06-10 NOTE — Telephone Encounter (Signed)
levaquin should continue to help, even though he has finished it. He is advised. He is not getting worse, he has improved.

## 2013-06-10 NOTE — Telephone Encounter (Signed)
Pt states was seen a couple of weeks ago and prescribed an antibiotic. States he finishes rx today, but feels he needs a refill Pt uses [facility] on [address] Please call pt to advise

## 2013-06-14 ENCOUNTER — Encounter: Payer: Self-pay | Admitting: Family Medicine

## 2013-06-14 ENCOUNTER — Ambulatory Visit (INDEPENDENT_AMBULATORY_CARE_PROVIDER_SITE_OTHER): Payer: BC Managed Care – PPO | Admitting: Family Medicine

## 2013-06-14 VITALS — BP 130/68 | HR 90 | Temp 99.7°F | Resp 16 | Ht 70.25 in | Wt 262.4 lb

## 2013-06-14 DIAGNOSIS — K5732 Diverticulitis of large intestine without perforation or abscess without bleeding: Secondary | ICD-10-CM | POA: Insufficient documentation

## 2013-06-14 DIAGNOSIS — I10 Essential (primary) hypertension: Secondary | ICD-10-CM

## 2013-06-14 DIAGNOSIS — J309 Allergic rhinitis, unspecified: Secondary | ICD-10-CM

## 2013-06-14 MED ORDER — AZITHROMYCIN 250 MG PO TABS: ORAL_TABLET | ORAL | Status: DC

## 2013-06-14 MED ORDER — FLUTICASONE PROPIONATE 50 MCG/ACT NA SUSP: 2.0000 | Freq: Every day | NASAL | Status: DC

## 2013-06-15 ENCOUNTER — Encounter: Payer: Self-pay | Admitting: Family Medicine

## 2013-06-15 NOTE — Progress Notes (Signed)
S: This 52 y.o. Cauc male is here for HTN follow-up; his BP is controlled w/o adverse medication effects. He does not check BP at home. He denies diaphoresis, fatigue, CP or tightness, palpitations, edema, SOB or DOE, cough, HA, dizziness, numbness, weakness or syncope.   Pt c/o R ear discomfort and swollen gland on R side of neck under ear. He has chronic allergies w/ PND, minimal sore throat or sneezing. He has significant nasal congestion, w/ allergic symptoms worse w/ seasonal changes and exposure to environmental allergens. He uses OTC antihistamine sporadically. He just finished an antibiotic a few days ago; this was prescribed for UTI.  Patient Active Problem List   Diagnosis Date Noted  . Diverticulitis of colon (without mention of hemorrhage) 06/14/2013  . HTN (hypertension) 04/11/2013  . Obesity (BMI 30-39.9) 11/23/2011  . Allergic rhinitis 11/23/2011   ROS: AS per HPI.  O: Filed Vitals:   06/14/13 1629  BP: 130/68  Pulse: 90  Temp: 99.7 F (37.6 C)  Resp: 16   GEN: In NAD: WN,WD. HEENT: [place]/AT; EOMI w/ clear conj/sclerae. EACs normal; R TM erythematous w/o effusion; normal markings and L TM normal. Nasal mucosa erythematous. Post ph erythematous w/ cobblestoning; uvula midline w/o exudate or lesions.  Sinuses- mildly tender R ethmoid w/ percussion. NECK: Supple w/o TMG. Shotty tonsillar node on R. COR: RRR. LUNGS; Normal resp rate and effort. SKIN: W&D. NEURO: A&O x 3; Nonfocal.  A/P: Allergic rhinitis- Take Allegra every day. Consider air purifiers in bedroom and living area in the home.                              RX: Z-PAK x 1. RX: Fluticasone NS- use at bedtime.  HTN (hypertension)- Stable; continue current med. Get home BP monitor and check regularly.  Meds ordered this encounter  Medications  . azithromycin (ZITHROMAX) 250 MG tablet    Sig: Take 2 tablets as first dose then 1 tablet daily for 4 days.    Dispense:  6 tablet    Refill:  0  . fluticasone  (FLONASE) 50 MCG/ACT nasal spray    Sig: Place 2 sprays into the nose daily.    Dispense:  16 g    Refill:  6

## 2013-06-19 ENCOUNTER — Telehealth: Payer: Self-pay

## 2013-06-19 NOTE — Telephone Encounter (Addendum)
Pt called and said he is almost finished with his z-pack and his ear infection is not completely better. Please call [phone]

## 2013-06-19 NOTE — Telephone Encounter (Signed)
Advised patient medication will continue to work for 10 more days. He is advised to continue the mucinex and the flonase. Advised him to give this more time, and I will send note to you to see if you recommend anything else. He states he is better, just not well.

## 2013-06-20 NOTE — Telephone Encounter (Signed)
Pt can also maintain good hydration and avoid milk and dairy products which can thicken mucous and worsen allergy symptoms. OTC AYR Nasal Mist may help reduce symptoms.

## 2013-06-21 NOTE — Telephone Encounter (Signed)
Thanks patient advised.  

## 2013-07-15 ENCOUNTER — Other Ambulatory Visit: Payer: Self-pay | Admitting: Physician Assistant

## 2013-07-19 ENCOUNTER — Telehealth: Payer: Self-pay | Admitting: Gastroenterology

## 2013-07-19 ENCOUNTER — Other Ambulatory Visit: Payer: BC Managed Care – PPO | Admitting: Gastroenterology

## 2013-08-28 ENCOUNTER — Ambulatory Visit: Payer: BC Managed Care – PPO | Admitting: Family Medicine

## 2014-04-05 ENCOUNTER — Other Ambulatory Visit: Payer: Self-pay | Admitting: Family Medicine

## 2014-05-07 ENCOUNTER — Other Ambulatory Visit: Payer: Self-pay | Admitting: Physician Assistant

## 2014-05-30 ENCOUNTER — Encounter: Payer: Self-pay | Admitting: Family Medicine

## 2014-05-30 ENCOUNTER — Ambulatory Visit (INDEPENDENT_AMBULATORY_CARE_PROVIDER_SITE_OTHER): Payer: BC Managed Care – PPO | Admitting: Family Medicine

## 2014-05-30 VITALS — BP 136/77 | HR 77 | Temp 98.0°F | Resp 16 | Ht 70.5 in | Wt 265.0 lb

## 2014-05-30 DIAGNOSIS — I10 Essential (primary) hypertension: Secondary | ICD-10-CM

## 2014-05-30 DIAGNOSIS — Z760 Encounter for issue of repeat prescription: Secondary | ICD-10-CM

## 2014-05-30 DIAGNOSIS — K5732 Diverticulitis of large intestine without perforation or abscess without bleeding: Secondary | ICD-10-CM

## 2014-05-30 MED ORDER — CIPROFLOXACIN HCL 500 MG PO TABS: 500.0000 mg | ORAL_TABLET | Freq: Two times a day (BID) | ORAL | Status: DC

## 2014-05-30 MED ORDER — LISINOPRIL-HYDROCHLOROTHIAZIDE 10-12.5 MG PO TABS: 1.0000 | ORAL_TABLET | Freq: Every day | ORAL | Status: DC

## 2014-05-30 MED ORDER — METRONIDAZOLE 500 MG PO TABS: 500.0000 mg | ORAL_TABLET | Freq: Three times a day (TID) | ORAL | Status: DC

## 2014-05-30 NOTE — Patient Instructions (Signed)
Contact [facility] to reschedule your colonoscopy procedure. It has been a year since it was initially scheduled.   Both antibiotics have been prescribed for 1 week; take Cipro twice a day and Metronidazole 3 times a day for 1 week.   Contact us if symptoms worsen.   I will see you again in ~ 6 months for CPE and HTN follow-up.

## 2014-06-01 NOTE — Progress Notes (Signed)
Subjective:    Patient ID: Kyle Price, male    DOB: 1960-12-03, 53 y.o.   MRN: [id number]  HPI  This 53 y.o. Cauc male has well controlled HTN; he is compliant w/ medication and reports no adverse affects. He denies diaphoresis, fatigue, vision disturbances, CP or tightness, palpitations, SOB or DOE, edema, HA, dizziness, numbness or syncope.  Pt has diverticular disease and c/o recent flare w/ L sided abd discomfort and minor change in stools. He has maintained good appetite and denies fever/chill, n/v/d or blood in stools. Last flare was treated w/ cipro and metronidazole; the latter was prescribed 4 times daily and pt did not tolerate this dosing. He had dizziness and increased GI upset; he discontinued metronidazole after 3-4 days.   CRS- pt was scheduled for colonoscopy last Sept but had a low grade fever on the day of procedure and was cancelled. He has not rescheduled but plan to do so this Fall. He is established w/ [facility]- Dr. Ardis Hughs.   Patient Active Problem List   Diagnosis Date Noted  . Diverticulitis of colon (without mention of hemorrhage) 06/14/2013  . HTN (hypertension) 04/11/2013  . Obesity (BMI 30-39.9) 11/23/2011  . Allergic rhinitis 11/23/2011     Prior to Admission medications   Medication Sig Start Date End Date Taking? Authorizing Provider  ibuprofen (ADVIL,MOTRIN) 200 MG tablet Take 400 mg by mouth every 6 (six) hours as needed for pain.   Yes Historical Provider, MD  lisinopril-hydrochlorothiazide (PRINZIDE,ZESTORETIC) 10-12.5 MG per tablet Take 1 tablet by mouth daily.   Yes Barton Fanny, MD  fexofenadine (ALLEGRA) 60 MG tablet Take 60 mg by mouth daily.    Historical Provider, MD  fluticasone (FLONASE) 50 MCG/ACT nasal spray Place 2 sprays into the nose daily. 06/14/13   Barton Fanny, MD  MOVIPREP 100 G SOLR Take 1 kit (200 g total) by mouth once. 05/15/13   Milus Banister, MD    History   Social History  . Marital Status: Married    Spouse Name: N/A    Number of Children: 1  . Years of Education: N/A   Occupational History  . Director of Technology    Social History Main Topics  . Smoking status: Former Research scientist (life sciences)  . Smokeless tobacco: Never Used  . Alcohol Use: No  . Drug Use: No  . Sexual Activity: Yes    Birth Control/ Protection: Condom     Comment: number of sex partners in the last 96 months 1   Other Topics Concern  . Not on file   Social History Narrative  . No narrative on file    Family History  Problem Relation Age of Onset  . Heart disease Father   . Cancer Father   . Heart disease Brother   . Diabetes Brother   . Cancer Mother   . Heart disease Maternal Grandmother   . Heart disease Maternal Grandfather   . Prostate cancer Maternal Grandfather   . Cancer Paternal Grandfather      Review of Systems  Constitutional: Negative.   Eyes: Negative.   Respiratory: Negative.   Cardiovascular: Negative.   Gastrointestinal: Positive for abdominal pain. Negative for nausea, vomiting, diarrhea, constipation, blood in stool and abdominal distention.  Genitourinary: Negative.   Neurological: Negative.   Psychiatric/Behavioral: Negative.       Objective:   Physical Exam  Nursing note and vitals reviewed. Constitutional: He is oriented to person, place, and time. He appears well-developed and well-nourished. No  distress.  HENT:  Head: Normocephalic and atraumatic.  Right Ear: External ear normal.  Left Ear: External ear normal.  Nose: Nose normal.  Mouth/Throat: Oropharynx is clear and moist.  Eyes: Conjunctivae and EOM are normal. Pupils are equal, round, and reactive to light. No scleral icterus.  Neck: Normal range of motion. Neck supple.  Cardiovascular: Normal rate and regular rhythm.   Pulmonary/Chest: Effort normal. No respiratory distress.  Abdominal: Soft. Bowel sounds are normal. He exhibits no mass. There is tenderness. There is no rebound and no guarding.  Left hypogastric and  LLQ tenderness- mild.  Musculoskeletal: Normal range of motion. He exhibits no edema.  Neurological: He is alert and oriented to person, place, and time. No cranial nerve deficit. He exhibits normal muscle tone. Coordination normal.  Skin: Skin is warm and dry. He is not diaphoretic. No erythema. No pallor.  Psychiatric: He has a normal mood and affect. His behavior is normal. Judgment and thought content normal.       Assessment & Plan:  Diverticulitis of colon (without mention of hemorrhage)- RX: Cipro and Metronidazole. Pt to reschedule CRS once this flare is completely resolved.  Essential hypertension- Stable on current medications.  Issue of repeat prescriptions   Meds ordered this encounter  Medications  . lisinopril-hydrochlorothiazide (PRINZIDE,ZESTORETIC) 10-12.5 MG per tablet    Sig: Take 1 tablet by mouth daily.    Dispense:  90 tablet    Refill:  3  . ciprofloxacin (CIPRO) 500 MG tablet    Sig: Take 1 tablet (500 mg total) by mouth 2 (two) times daily.    Dispense:  14 tablet    Refill:  0  . metroNIDAZOLE (FLAGYL) 500 MG tablet    Sig: Take 1 tablet (500 mg total) by mouth 3 (three) times daily.    Dispense:  21 tablet    Refill:  0

## 2014-06-12 ENCOUNTER — Other Ambulatory Visit: Payer: Self-pay | Admitting: *Deleted

## 2014-06-12 DIAGNOSIS — G4733 Obstructive sleep apnea (adult) (pediatric): Secondary | ICD-10-CM

## 2014-08-15 ENCOUNTER — Ambulatory Visit: Payer: BC Managed Care – PPO | Admitting: Family Medicine

## 2014-08-21 ENCOUNTER — Encounter: Payer: Self-pay | Admitting: Family Medicine

## 2014-08-21 ENCOUNTER — Ambulatory Visit: Payer: BC Managed Care – PPO | Admitting: Family Medicine

## 2014-08-21 ENCOUNTER — Ambulatory Visit (INDEPENDENT_AMBULATORY_CARE_PROVIDER_SITE_OTHER): Payer: BC Managed Care – PPO | Admitting: Family Medicine

## 2014-08-21 VITALS — BP 140/80 | HR 63 | Temp 98.1°F | Resp 16 | Ht 71.0 in | Wt 264.8 lb

## 2014-08-21 DIAGNOSIS — E669 Obesity, unspecified: Secondary | ICD-10-CM

## 2014-08-21 DIAGNOSIS — I10 Essential (primary) hypertension: Secondary | ICD-10-CM

## 2014-08-21 NOTE — Progress Notes (Signed)
S: This 53 y.o. 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 Pt reports no sleep disorder symptoms; he does not snore and denies daytime sleepiness or fatigue. His wife has never mentioned any episodes that resemble apnea. Pt states he is a very quiet sleeper.  Patient Active Problem List   Diagnosis Date Noted  . Diverticulitis of colon (without mention of hemorrhage) 06/14/2013  . HTN (hypertension) 04/11/2013  . Obesity (BMI 30-39.9) 11/23/2011  . Allergic rhinitis 11/23/2011    Prior to Admission medications   Medication Sig Start Date End Date Taking? Authorizing Provider  fexofenadine (ALLEGRA) 60 MG tablet Take 60 mg by mouth daily.   Yes Historical Provider, MD  lisinopril-hydrochlorothiazide (PRINZIDE,ZESTORETIC) 10-12.5 MG per tablet Take 1 tablet by mouth daily. 05/30/14  Yes Barbara B McPherson, MD  fluticasone (FLONASE) 50 MCG/ACT nasal spray Place 2 sprays into the nose daily. Patient not taking: Reported on 08/21/2014 06/14/13   Barbara B McPherson, MD  ibuprofen (ADVIL,MOTRIN) 200 MG tablet Take 400 mg by mouth every 6 (six) hours as needed for pain.    Historical Provider, MD  MOVIPREP 100 G SOLR Take 1 kit (200 g total) by mouth once. Patient not taking: Reported on 08/21/2014 05/15/13   Daniel P Jacobs, MD    ROS: As per HPI; otherwise noncontributory.   O: Filed Vitals:   08/21/14 1030  BP: 140/80  Pulse: 63  Temp: 98.1 F (36.7 C)  Resp: 16   GEN: In NAD; WN,WD. HENT: [place]/AT; EOMI w/ clear conj/sclerae. Otherwise, unremarkable. NECK: Supple; circumference not measured. COR: RRR. LUNGS: Unlabored resp and effort. SKIN; W&D; intact w/o diaphoresis or pallor. NEURO: A&O x 3; CNs intact. Nonfocal.  EPWORTH SLEEPINESS SCALE  FORM: Score= 3 (from scanned into EPIC).   A/P: Essential hypertension- Stable on current medication.   Obesity (BMI 30-39.9)- Continue efforts at weight reduction. Letter given to pt re: lack of symptoms to warrant sleep study based on Epworth Score.        

## 2015-01-10 ENCOUNTER — Telehealth: Payer: Self-pay | Admitting: Family Medicine

## 2015-01-10 NOTE — Telephone Encounter (Signed)
lmom to reschedule his appt for a CPE that he had a 02-19-15 with another provider Dr Audria NineMcpherson will be retired by then

## 2015-02-19 ENCOUNTER — Encounter: Payer: BC Managed Care – PPO | Admitting: Family Medicine

## 2015-04-08 IMAGING — CR DG CHEST 2V
2 series · 2 of 2 positions shown · non-contrast
Comparison: None

CLINICAL DATA: Shortness of breath and heart murmur.

CHEST - 2 VIEW

[w chest pa]
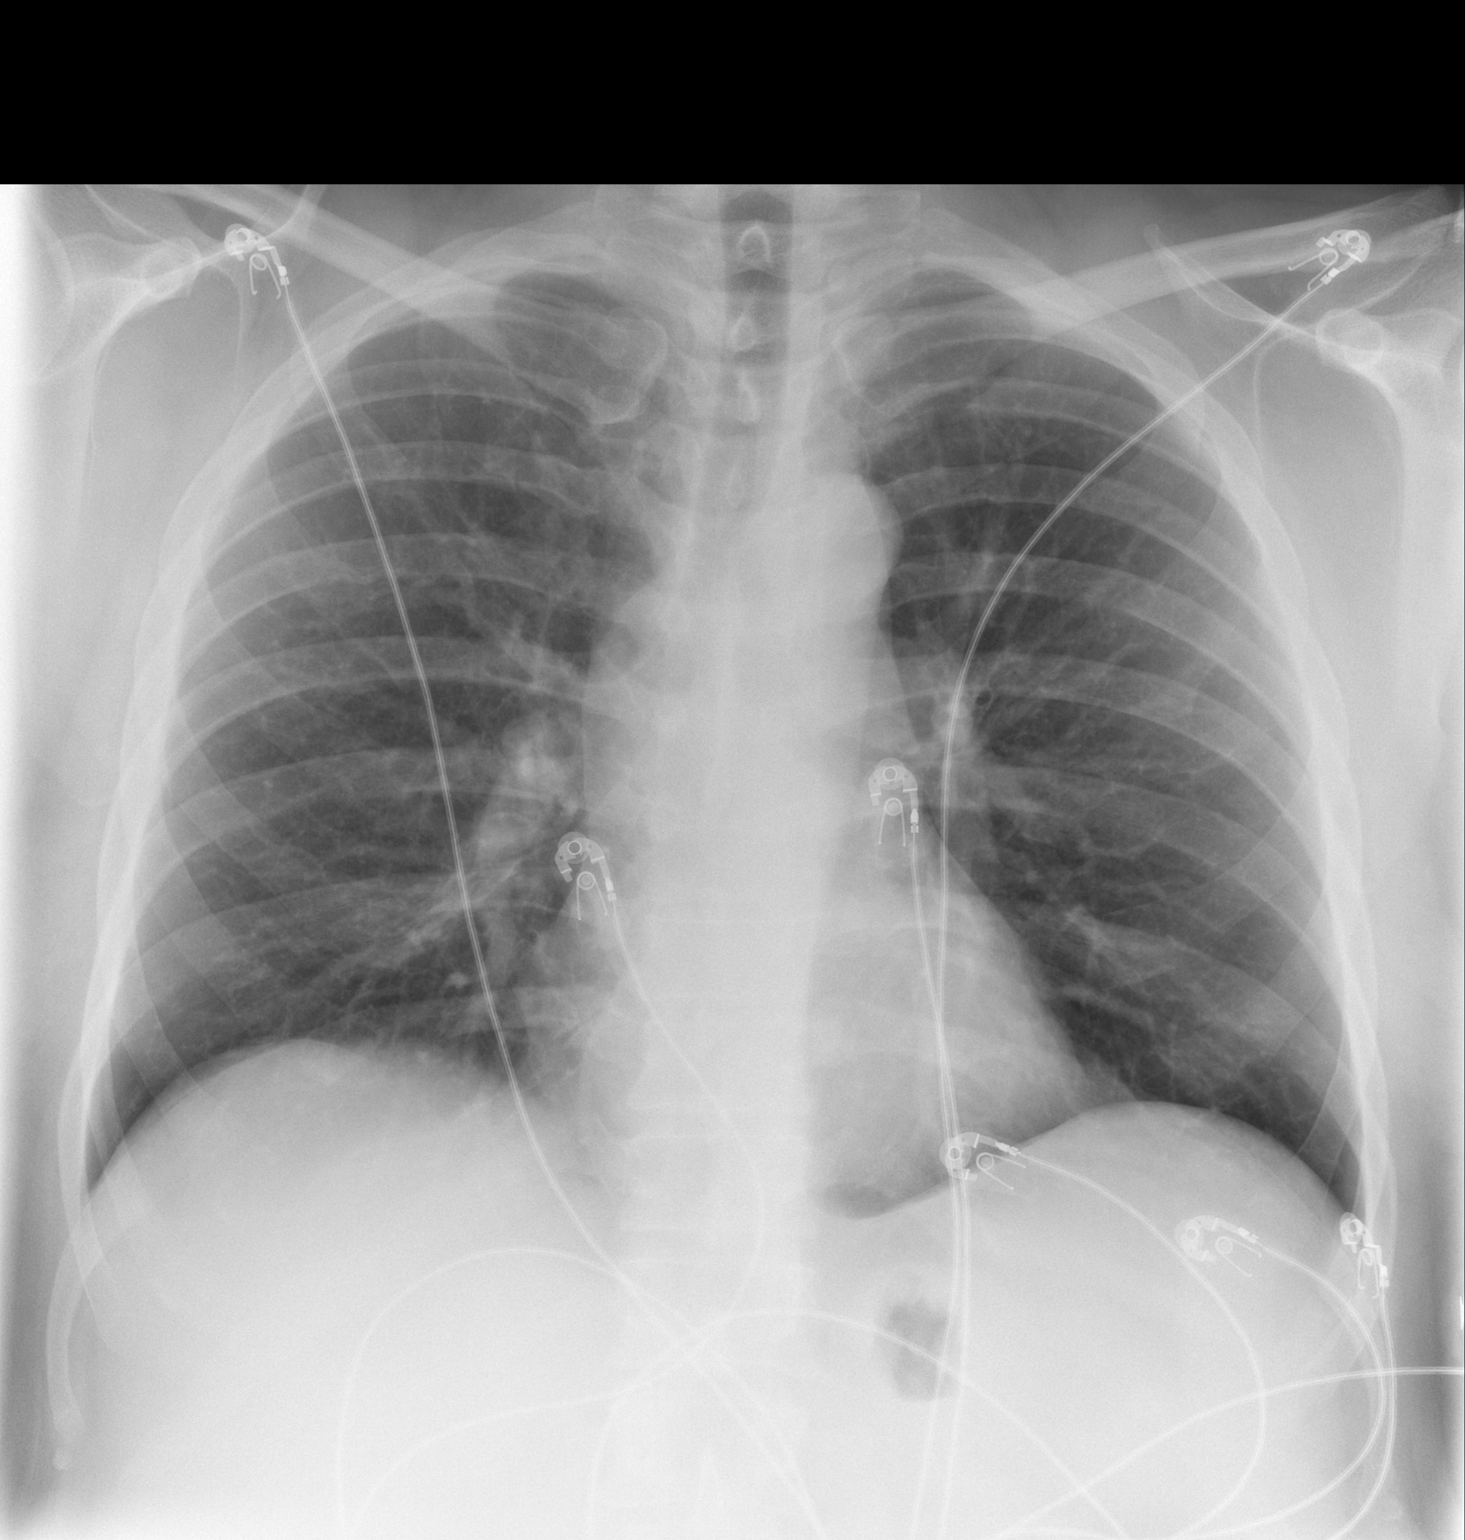

[w chest lat]
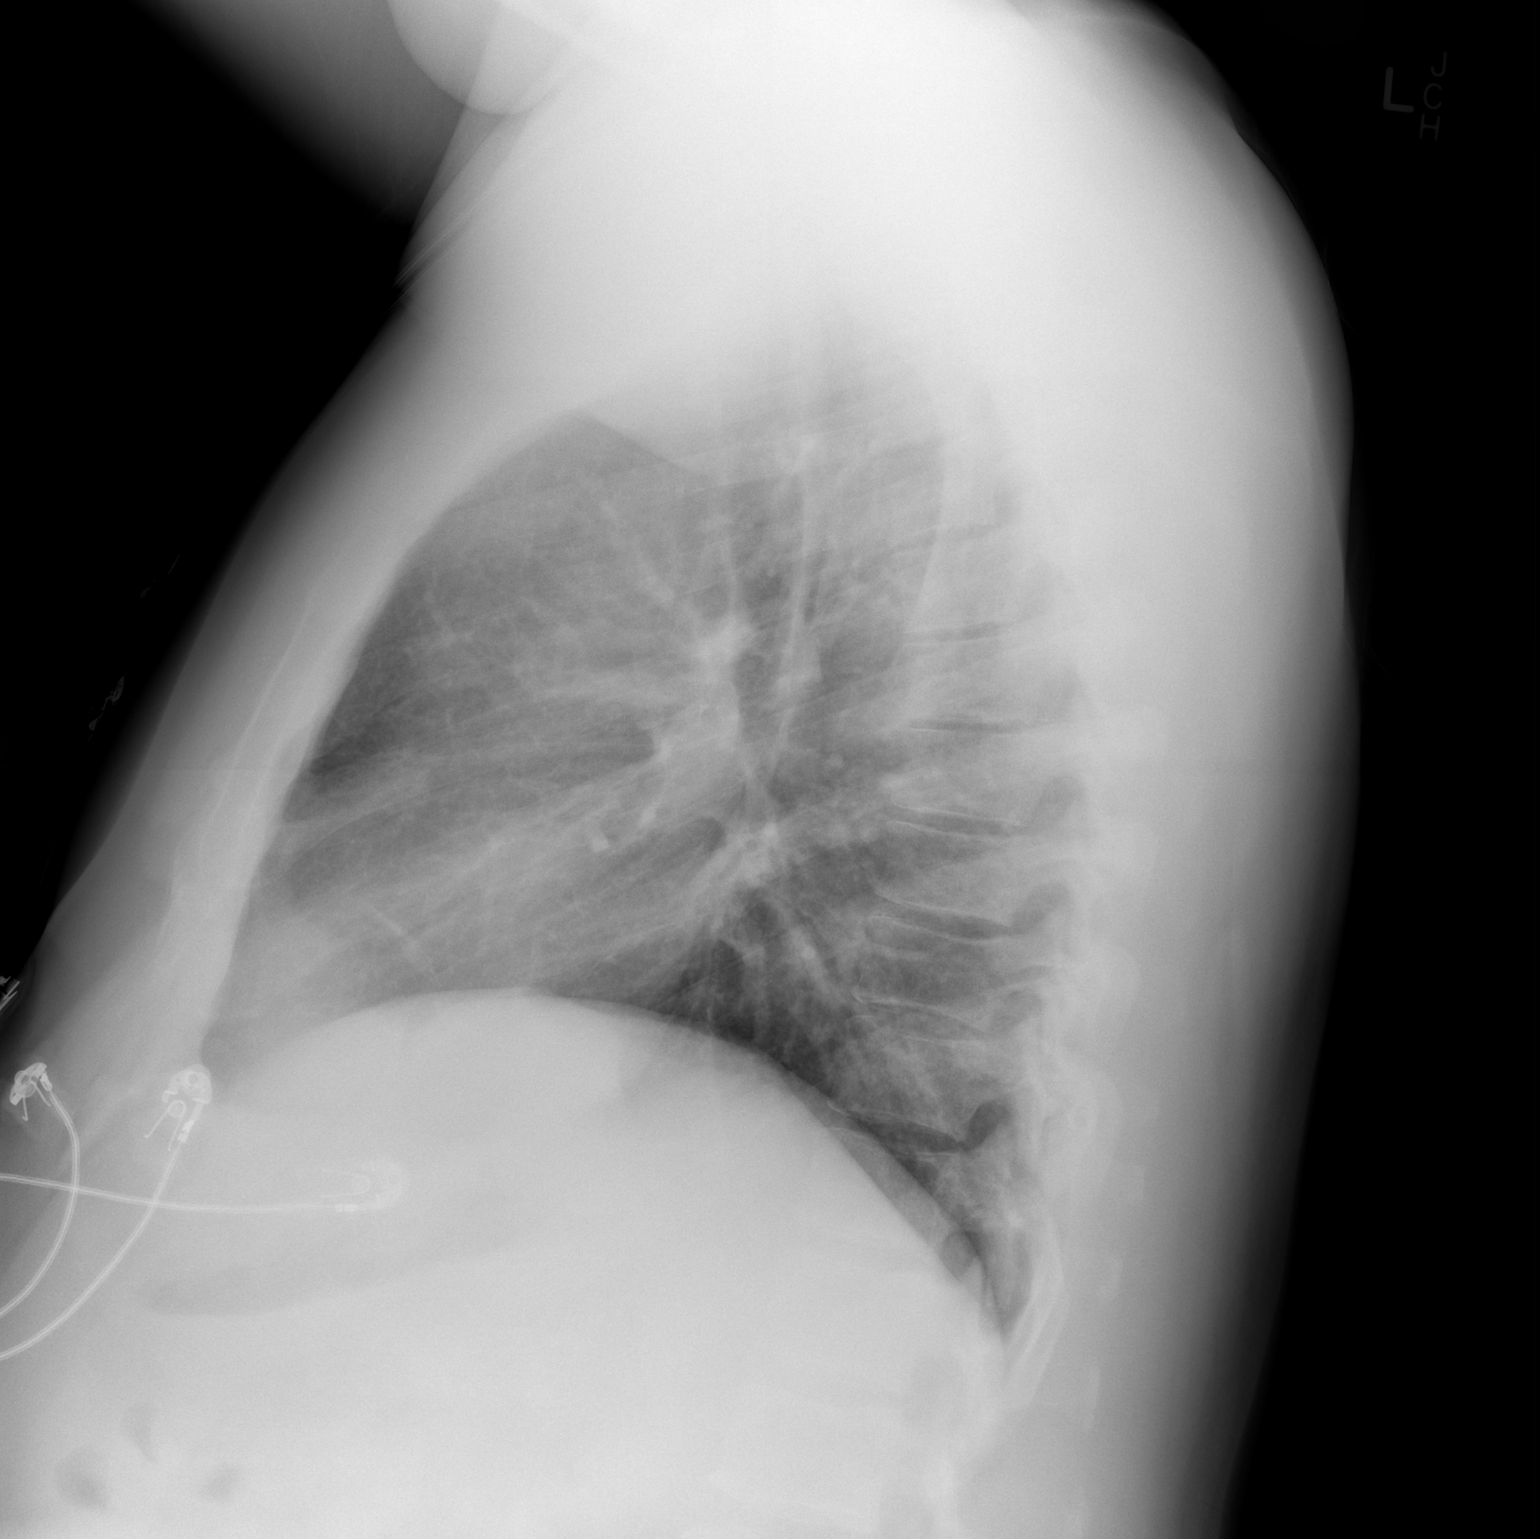

[2 of 2 positions shown; findings below may reference images not displayed]

FINDINGS: The heart size and mediastinal contours are within normal
limits.  Both lungs are clear.  The visualized skeletal structures
are unremarkable.
IMPRESSION: No active disease.

## 2015-05-29 ENCOUNTER — Other Ambulatory Visit: Payer: Self-pay

## 2015-05-29 MED ORDER — LISINOPRIL-HYDROCHLOROTHIAZIDE 10-12.5 MG PO TABS: 1.0000 | ORAL_TABLET | Freq: Every day | ORAL | Status: DC

## 2015-06-04 ENCOUNTER — Encounter: Payer: Self-pay | Admitting: Urgent Care

## 2015-06-04 NOTE — Progress Notes (Signed)
This encounter was created in error - please disregard.

## 2015-06-04 NOTE — Progress Notes (Deleted)
    MRN: [id number] DOB: October 22, 1960  Subjective:   Camaron Cammack is a 54 y.o. male presenting for follow up on ***. Reports . Has tried *** relief. Denies . *** smoking, *** alcohol. Denies any other aggravating or relieving factors, no other questions or concerns.  Ignace has a current medication list which includes the following prescription(s): fexofenadine, fluticasone, ibuprofen, lisinopril-hydrochlorothiazide, and moviprep. Also is allergic to ciprofloxacin; sulfa antibiotics; and pork-derived products.  [place]  has a past medical history of Allergy; Depression; Anxiety; IBS (irritable bowel syndrome); Trigger index finger of left hand; Trigger little finger of right hand; and Diverticulitis. Also  has past surgical history that includes Hernia repair.  Objective:   Vitals: There were no vitals taken for this visit.  Physical Exam  No results found for this or any previous visit (from the past 24 hour(s)).  Assessment and Plan :     Wallis Bamberg, PA-C [facility] [phone] 06/04/2015 1:00 PM

## 2015-06-25 ENCOUNTER — Other Ambulatory Visit: Payer: Self-pay | Admitting: Urgent Care

## 2015-07-25 ENCOUNTER — Other Ambulatory Visit: Payer: Self-pay | Admitting: Physician Assistant

## 2015-07-31 ENCOUNTER — Telehealth: Payer: Self-pay | Admitting: *Deleted

## 2015-07-31 ENCOUNTER — Ambulatory Visit: Payer: Self-pay | Admitting: Family Medicine

## 2015-07-31 NOTE — Telephone Encounter (Signed)
Spoke with patient, he forgot his appointment, rescheduled for 08/05/15.

## 2015-08-05 ENCOUNTER — Encounter: Payer: Self-pay | Admitting: Family Medicine

## 2015-08-05 ENCOUNTER — Ambulatory Visit (INDEPENDENT_AMBULATORY_CARE_PROVIDER_SITE_OTHER): Payer: BLUE CROSS/BLUE SHIELD | Admitting: Family Medicine

## 2015-08-05 VITALS — BP 134/77 | HR 72 | Temp 98.1°F | Resp 16 | Ht 71.0 in | Wt 270.0 lb

## 2015-08-05 DIAGNOSIS — I10 Essential (primary) hypertension: Secondary | ICD-10-CM

## 2015-08-05 DIAGNOSIS — M79643 Pain in unspecified hand: Secondary | ICD-10-CM

## 2015-08-05 LAB — CBC
HCT: 43.2 % (ref 39.0–52.0)
HEMOGLOBIN: 14 g/dL (ref 13.0–17.0)
MCH: 29.2 pg (ref 26.0–34.0)
MCHC: 32.4 g/dL (ref 30.0–36.0)
MCV: 90.2 fL (ref 78.0–100.0)
MPV: 11.6 fL (ref 8.6–12.4)
Platelets: 180 10*3/uL (ref 150–400)
RBC: 4.79 MIL/uL (ref 4.22–5.81)
RDW: 12.7 % (ref 11.5–15.5)
WBC: 6 10*3/uL (ref 4.0–10.5)

## 2015-08-05 LAB — COMPREHENSIVE METABOLIC PANEL
ALBUMIN: 4.3 g/dL (ref 3.6–5.1)
ALT: 44 U/L (ref 9–46)
AST: 23 U/L (ref 10–35)
Alkaline Phosphatase: 76 U/L (ref 40–115)
BUN: 15 mg/dL (ref 7–25)
CALCIUM: 9.5 mg/dL (ref 8.6–10.3)
CHLORIDE: 99 mmol/L (ref 98–110)
CO2: 25 mmol/L (ref 20–31)
CREATININE: 0.94 mg/dL (ref 0.70–1.33)
GLUCOSE: 106 mg/dL — AB (ref 65–99)
Potassium: 4.1 mmol/L (ref 3.5–5.3)
SODIUM: 134 mmol/L — AB (ref 135–146)
Total Bilirubin: 0.6 mg/dL (ref 0.2–1.2)
Total Protein: 7.2 g/dL (ref 6.1–8.1)

## 2015-08-05 LAB — LIPID PANEL
Cholesterol: 187 mg/dL (ref 125–200)
HDL: 38 mg/dL — AB (ref >=40)
LDL CALC: 109 mg/dL (ref <130)
TRIGLYCERIDES: 202 mg/dL — AB (ref <150)
Total CHOL/HDL Ratio: 4.9 Ratio (ref <=5.0)
VLDL: 40 mg/dL — AB (ref <30)

## 2015-08-05 MED ORDER — LISINOPRIL-HYDROCHLOROTHIAZIDE 10-12.5 MG PO TABS: ORAL_TABLET | ORAL | Status: DC

## 2015-08-05 NOTE — Progress Notes (Signed)
Subjective:    Patient ID: Kyle Price, male    DOB: 11-07-1960, 54 y.o.   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 He presents today for follow up of HTN. He has been doing well on lisinopril-hctz. He is aware of weight gain and need to lose weight. He is planning on starting Atkins diet soon and exercising more. He has strong family history of heart disease on his father's side of the family and including the patient's brother.   He is currently on clindamyacin for dental infection. Tolerating well.   He has noticed pain in hands and numbness in thumb and fingers 2 and 3. He would like a referral to hand specialist. He plays several musical instruments and his pain gets in the way of his ability to play. 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   Past Medical History  Diagnosis Date  . Allergy   . Depression   . Anxiety   . IBS (irritable bowel syndrome)   . Trigger index finger of left hand   . Trigger little finger of right hand   . Diverticulitis    Past Surgical History  Procedure Laterality Date  . Hernia repair     Family History  Problem Relation Age of Onset  . Heart disease Father   . Cancer Father   . Heart disease Brother   . Diabetes Brother   . Cancer Mother   . Heart disease Maternal Grandmother   . Heart disease Maternal Grandfather   . Prostate cancer Maternal Grandfather   . Cancer Paternal Grandfather    Review of Systems  Respiratory: Negative for cough, chest tightness and shortness of breath.   Cardiovascular: Negative for chest pain and leg swelling.  Gastrointestinal: Negative for abdominal pain, diarrhea and constipation.  Musculoskeletal: Positive for arthralgias (bilateral hands).  Neurological: Negative for headaches.      Objective:   Physical Exam Physical Exam  Constitutional: Oriented to person, place, and time. He appears well-developed and  well-nourished.  HENT:  Head: Normocephalic and atraumatic.  Eyes: Conjunctivae are normal.  Neck: Normal range of motion. Neck supple.  Cardiovascular: Normal rate, regular rhythm and normal heart sounds.   Pulmonary/Chest: Effort normal and breath sounds normal.  Musculoskeletal: Normal range of motion. Hands and wrists without swelling. Good ROM, good pulses, brisk cap refill. Negative phalen's and tinel signs.  Neurological: Alert and oriented to person, place, and time.  Skin: Skin is warm and dry.  Psychiatric: Normal mood and affect. Behavior is normal. Judgment and thought content normal.  Vitals reviewed.  BP 134/77 mmHg  Pulse 72  Temp(Src) 98.1 F (36.7 C)  Resp 16  Ht  (1.803 m)  Wt 270 lb (122.471 kg)  BMI 37.67 kg/m2 Wt Readings from Last 3 Encounters:  08/05/15 270 lb (122.471 kg)  08/21/14 264 lb 12.8 oz (120.112 kg)  05/30/14 265 lb (120.203 kg)      Assessment & Plan:  1. Essential hypertension - CBC - Comprehensive metabolic panel - Lipid panel - lisinopril-hydrochlorothiazide (PRINZIDE,ZESTORETIC) 10-12.5 MG tablet; TAKE 1 TABLET BY MOUTH DAILY  "NO MORE REFILLS WITH OUT OFFICE VISIT"  Dispense: 90 tablet; Refill: 1  2. Pain of hand, unspecified laterality - Ambulatory referral to Hand Surgery  - follow up in 6 months  3. Obesity - encouraged weight loss and increased activity, discussed pros and cons of Atkin's weight  loss plan.   Olean Reeeborah Kelty Szafran, FNP-BC  [facility], [facility]  08/09/2015 2:45 PM

## 2015-09-01 ENCOUNTER — Ambulatory Visit (INDEPENDENT_AMBULATORY_CARE_PROVIDER_SITE_OTHER): Payer: BLUE CROSS/BLUE SHIELD | Admitting: Physician Assistant

## 2015-09-01 ENCOUNTER — Encounter: Payer: Self-pay | Admitting: Physician Assistant

## 2015-09-01 VITALS — BP 130/70 | HR 77 | Temp 98.1°F | Resp 16 | Ht 70.75 in | Wt 265.8 lb

## 2015-09-01 DIAGNOSIS — IMO0001 Reserved for inherently not codable concepts without codable children: Secondary | ICD-10-CM

## 2015-09-01 DIAGNOSIS — Z711 Person with feared health complaint in whom no diagnosis is made: Secondary | ICD-10-CM

## 2015-09-01 NOTE — Progress Notes (Signed)
[facility] [address] [place] [address] [phone]  Date:  09/01/2015   Name:  Kyle Price   DOB:  [birth year]   MRN:  [id number]  PCP:  Ellsworth Lennox, MD    History of Present Illness:  Kyle Price is a 54 y.o. male patient who presents to [facility] for cc of concern of mass at lower left of sternum.  This is not painful or red.  He has no hx of sob or dyspnea.    No concern of diaphoresis, fever, or nausea.  No radiating sensation anywhere.  Patient Active Problem List   Diagnosis Date Noted  . Diverticulitis of colon (without mention of hemorrhage) 06/14/2013  . HTN (hypertension) 04/11/2013  . Obesity (BMI 30-39.9) 11/23/2011  . Allergic rhinitis 11/23/2011    Past Medical History  Diagnosis Date  . Allergy   . Depression   . Anxiety   . IBS (irritable bowel syndrome)   . Trigger index finger of left hand   . Trigger little finger of right hand   . Diverticulitis     Past Surgical History  Procedure Laterality Date  . Hernia repair      Social History  Substance Use Topics  . Smoking status: Former Research scientist (life sciences)  . Smokeless tobacco: Never Used  . Alcohol Use: No    Family History  Problem Relation Age of Onset  . Heart disease Father   . Cancer Father   . Heart disease Brother   . Diabetes Brother   . Cancer Mother   . Heart disease Maternal Grandmother   . Heart disease Maternal Grandfather   . Prostate cancer Maternal Grandfather   . Cancer Paternal Grandfather     Allergies  Allergen Reactions  . Ciprofloxacin     dizziness  . Sulfa Antibiotics     rash  . Pork-Derived Products Rash    Medication list has been reviewed and updated.  Current Outpatient Prescriptions on File Prior to Visit  Medication Sig Dispense Refill  . ibuprofen (ADVIL,MOTRIN) 200 MG tablet Take 400 mg by mouth every 6 (six) hours as needed for pain.    Marland Kitchen lisinopril-hydrochlorothiazide (PRINZIDE,ZESTORETIC) 10-12.5 MG tablet TAKE 1  TABLET BY MOUTH DAILY  "NO MORE REFILLS WITH OUT OFFICE VISIT" 90 tablet 1  . MOVIPREP 100 G SOLR Take 1 kit (200 g total) by mouth once. (Patient not taking: Reported on 09/01/2015) 1 kit 0   No current facility-administered medications on file prior to visit.    ROS ROS otherwise unremarkable unless listed.  Physical Examination: BP 130/70 mmHg  Pulse 77  Temp(Src) 98.1 F (36.7 C) (Oral)  Resp 16  Ht 5' 10.75" (1.797 m)  Wt 265 lb 12.8 oz (120.566 kg)  BMI 37.34 kg/m2  SpO2 98% Ideal Body Weight: Weight in (lb) to have BMI = 25: 177.6  Physical Exam  Constitutional: He is oriented to person, place, and time. He appears well-developed and well-nourished. No distress.  HENT:  Head: Normocephalic and atraumatic.  Eyes: Conjunctivae and EOM are normal. Pupils are equal, round, and reactive to light.  Cardiovascular: Normal rate.   Pulmonary/Chest: Effort normal. No respiratory distress.  Musculoskeletal:  No tenderness upon palpation to the sternum or costal border and ribs.  No redness.  No crepitus.   No mass upon palpation.  Xyphoid process is prominent.  Neurological: He is alert and oriented to person, place, and time.  Skin: Skin is warm and dry. He is not diaphoretic.  Psychiatric: He has a normal mood and affect. His behavior is normal.     Assessment and Plan: Kyle Price is a 54 y.o. male who is here today for cc of mass at chest.  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  No other co-sxs that warrant more evaluation.  I reassured patient that this is the xyphoid.  I have offered imaging for reassurance.  He declines at this time.  I have advised him to monitor, and if it looks concerning, change in size--to return.  Patient voiced understanding.   Normal appearance   Ivar Drape, PA-C [facility] 09/01/2015 3:33 PM

## 2015-09-01 NOTE — Patient Instructions (Addendum)
I would like you to keep an eye on this area.  At this point, it does appear to be a part of the xyphoid process.  However, I would always be able to order an ultrasound or other imaging modalities for that reassurance.   Please let me know if this area changes or you would like to go ahead with imaging.     I would like you to follow up within 6 months for a physical exam.  I can then recheck the area.

## 2015-11-24 ENCOUNTER — Encounter: Payer: Self-pay | Admitting: Physician Assistant

## 2015-11-24 ENCOUNTER — Ambulatory Visit (INDEPENDENT_AMBULATORY_CARE_PROVIDER_SITE_OTHER): Payer: Commercial Managed Care - HMO | Admitting: Physician Assistant

## 2015-11-24 VITALS — BP 123/76 | HR 76 | Temp 98.6°F | Resp 16 | Ht 71.5 in | Wt 269.0 lb

## 2015-11-24 DIAGNOSIS — R42 Dizziness and giddiness: Secondary | ICD-10-CM

## 2015-11-24 DIAGNOSIS — J019 Acute sinusitis, unspecified: Secondary | ICD-10-CM | POA: Diagnosis not present

## 2015-11-24 DIAGNOSIS — Z658 Other specified problems related to psychosocial circumstances: Secondary | ICD-10-CM

## 2015-11-24 LAB — POCT CBC
Granulocyte percent: 67.7 %G (ref 37–80)
HCT, POC: 40.9 % — AB (ref 43.5–53.7)
HEMOGLOBIN: 15 g/dL (ref 14.1–18.1)
LYMPH, POC: 2.2 (ref 0.6–3.4)
MCH, POC: 31 pg (ref 27–31.2)
MCHC: 36.7 g/dL — AB (ref 31.8–35.4)
MCV: 84.7 fL (ref 80–97)
MID (cbc): 0.1 (ref 0–0.9)
MPV: 7.2 fL (ref 0–99.8)
POC Granulocyte: 4.8 (ref 2–6.9)
POC LYMPH PERCENT: 31.2 %L (ref 10–50)
POC MID %: 1.1 %M (ref 0–12)
Platelet Count, POC: 255 10*3/uL (ref 142–424)
RBC: 4.83 M/uL (ref 4.69–6.13)
RDW, POC: 13.5 %
WBC: 7.1 10*3/uL (ref 4.6–10.2)

## 2015-11-24 LAB — POCT GLYCOSYLATED HEMOGLOBIN (HGB A1C): HEMOGLOBIN A1C: 6.3

## 2015-11-24 LAB — GLUCOSE, POCT (MANUAL RESULT ENTRY): POC GLUCOSE: 75 mg/dL (ref 70–99)

## 2015-11-24 MED ORDER — FLUTICASONE PROPIONATE 50 MCG/ACT NA SUSP: 2.0000 | Freq: Every day | NASAL | Status: DC

## 2015-11-24 MED ORDER — AZITHROMYCIN 250 MG PO TABS: ORAL_TABLET | ORAL | Status: DC

## 2015-11-24 NOTE — Patient Instructions (Addendum)
I will be in touch with you regarding your results of your blood work.  Please continue to take the zyrtec, and start the flonase.  i would like you to take the antibiotic to completion.
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
## 2015-11-25 LAB — BASIC METABOLIC PANEL
BUN: 15 mg/dL (ref 7–25)
CALCIUM: 9.3 mg/dL (ref 8.6–10.3)
CO2: 22 mmol/L (ref 20–31)
CREATININE: 1.33 mg/dL (ref 0.70–1.33)
Chloride: 99 mmol/L (ref 98–110)
GLUCOSE: 73 mg/dL (ref 65–99)
POTASSIUM: 3.8 mmol/L (ref 3.5–5.3)
Sodium: 137 mmol/L (ref 135–146)

## 2015-12-28 ENCOUNTER — Other Ambulatory Visit: Payer: Self-pay | Admitting: Physician Assistant

## 2015-12-28 DIAGNOSIS — Z658 Other specified problems related to psychosocial circumstances: Secondary | ICD-10-CM

## 2015-12-28 DIAGNOSIS — F509 Eating disorder, unspecified: Secondary | ICD-10-CM

## 2016-02-07 ENCOUNTER — Other Ambulatory Visit: Payer: Self-pay | Admitting: Family Medicine

## 2016-04-27 ENCOUNTER — Ambulatory Visit (INDEPENDENT_AMBULATORY_CARE_PROVIDER_SITE_OTHER): Payer: 59 | Admitting: Physician Assistant

## 2016-04-27 ENCOUNTER — Telehealth: Payer: Self-pay | Admitting: *Deleted

## 2016-04-27 ENCOUNTER — Ambulatory Visit (HOSPITAL_COMMUNITY): Admission: RE | Admit: 2016-04-27 | Payer: 59 | Source: Ambulatory Visit

## 2016-04-27 VITALS — BP 130/80 | HR 81 | Temp 98.5°F | Resp 17 | Ht 71.5 in | Wt 271.0 lb

## 2016-04-27 DIAGNOSIS — R1032 Left lower quadrant pain: Secondary | ICD-10-CM

## 2016-04-27 LAB — POCT CBC
GRANULOCYTE PERCENT: 67 % (ref 37–80)
HEMATOCRIT: 43.8 % (ref 43.5–53.7)
Hemoglobin: 15.8 g/dL (ref 14.1–18.1)
Lymph, poc: 2.1 (ref 0.6–3.4)
MCH: 30.3 pg (ref 27–31.2)
MCHC: 36.1 g/dL — AB (ref 31.8–35.4)
MCV: 84.1 fL (ref 80–97)
MID (CBC): 0.3 (ref 0–0.9)
MPV: 7.5 fL (ref 0–99.8)
POC Granulocyte: 4.9 (ref 2–6.9)
POC LYMPH PERCENT: 28.5 %L (ref 10–50)
POC MID %: 4.5 % (ref 0–12)
Platelet Count, POC: 258 10*3/uL (ref 142–424)
RBC: 5.2 M/uL (ref 4.69–6.13)
RDW, POC: 13.3 %
WBC: 7.3 10*3/uL (ref 4.6–10.2)

## 2016-04-27 MED ORDER — AMOXICILLIN-POT CLAVULANATE 875-125 MG PO TABS: 1.0000 | ORAL_TABLET | Freq: Two times a day (BID) | ORAL | 0 refills | Status: DC

## 2016-04-27 NOTE — Telephone Encounter (Signed)
Imaging dept from [facility] called and stated that patient would have to pay $1000 for scan.  Pt stated that he would not like to do it because it cost so much.  Per Casimiro NeedleMichael he is to start antibiotic.  After he gets better he needs to come in for CPE.  Patient notified.

## 2016-04-27 NOTE — Patient Instructions (Addendum)
  You are to go over to [facility] now for your scan. Address: [address], [place], [place] [address]. [phone]     IF you received an x-ray today, you will receive an invoice from [facility]. Please contact [facility] at [phone] with questions or concerns regarding your invoice.   IF you received labwork today, you will receive an invoice from [facility]. Please contact [facility] at [phone] with questions or concerns regarding your invoice.   Our billing staff will not be able to assist you with questions regarding bills from these companies.  You will be contacted with the lab results as soon as they are available. The fastest way to get your results is to activate your My Chart account. Instructions are located on the last page of this paperwork. If you have not heard from us regarding the results in 2 weeks, please contact this office.

## 2016-04-27 NOTE — Progress Notes (Signed)
04/27/2016 3:13 PM   DOB: [birth year] / MRN: [id number]  SUBJECTIVE:  Kyle Price is a 55 y.o. male presenting for left lower abdominal quadrant that he attributes to diverticulitis.  He has never had a colonoscopy or a CT scan.  He has taken antibiotics in the past and this resolved his symptoms.  This has happened to him three time total in the last six years.  He associates subjective fever.  He has no appetite.  He did have one lose bowel movement this morning.  Reports he will "sometimes" get a rash to Cipro.  Has taken augmentin in the past without any problems.    He is allergic to ciprofloxacin; sulfa antibiotics; and pork-derived products.   He  has a past medical history of Allergy; Anxiety; Depression; Diverticulitis; IBS (irritable bowel syndrome); Trigger index finger of left hand; and Trigger little finger of right hand.    He  reports that he has quit smoking. He has never used smokeless tobacco. He reports that he does not drink alcohol or use drugs. He  reports that he currently engages in sexual activity. He reports using the following method of birth control/protection: Condom. The patient  has a past surgical history that includes Hernia repair.  His family history includes Cancer in his father, mother, and paternal grandfather; Diabetes in his brother; Heart disease in his brother, father, maternal grandfather, and maternal grandmother; Prostate cancer in his maternal grandfather.  Review of Systems  Respiratory: Negative for cough.   Cardiovascular: Negative for chest pain and palpitations.  Gastrointestinal: Positive for abdominal pain. Negative for blood in stool, constipation, diarrhea, heartburn and nausea.  Musculoskeletal: Negative for myalgias.  Skin: Negative for rash.  Neurological: Negative for dizziness.    The problem list and medications were reviewed and updated by myself where necessary and exist elsewhere in the encounter.   OBJECTIVE:  BP 130/80  (BP Location: Right Arm, Patient Position: Sitting, Cuff Size: Normal)   Pulse 81   Temp 98.5 F (36.9 C) (Oral)   Resp 17   Ht 5' 11.5" (1.816 m)   Wt 271 lb (122.9 kg)   SpO2 96%   BMI 37.27 kg/m   Physical Exam  Constitutional: He is oriented to person, place, and time.  Cardiovascular: Normal rate, regular rhythm and normal heart sounds.  Exam reveals no gallop and no friction rub.   No murmur heard. Pulmonary/Chest: Effort normal and breath sounds normal. No respiratory distress. He has no wheezes. He has no rales. He exhibits no tenderness.  Abdominal: Soft. Bowel sounds are normal. He exhibits no distension and no mass. There is tenderness (left upper and llower quadrant.). There is guarding. There is no rebound.  Neurological: He is alert and oriented to person, place, and time.  Skin: He is not diaphoretic. No pallor.  Psychiatric: He has a normal mood and affect.    Results for orders placed or performed in visit on 04/27/16 (from the past 72 hour(s))  POCT CBC     Status: Abnormal   Collection Time: 04/27/16  2:30 PM  Result Value Ref Range   WBC 7.3 4.6 - 10.2 K/uL   Lymph, poc 2.1 0.6 - 3.4   POC LYMPH PERCENT 28.5 10 - 50 %L   MID (cbc) 0.3 0 - 0.9   POC MID % 4.5 0 - 12 %M   POC Granulocyte 4.9 2 - 6.9   Granulocyte percent 67.0 37 - 80 %G   RBC 5.20 4.69 -  6.13 M/uL   Hemoglobin 15.8 14.1 - 18.1 g/dL   HCT, POC 16.143.8 09.643.5 - 53.7 %   MCV 84.1 80 - 97 fL   MCH, POC 30.3 27 - 31.2 pg   MCHC 36.1 (A) 31.8 - 35.4 g/dL   RDW, POC 04.513.3 %   Platelet Count, POC 258 142 - 424 K/uL   MPV 7.5 0 - 99.8 fL    No results found.  ASSESSMENT AND PLAN  Kyle Price was seen today for abdominal pain and diverticulitis.  Diagnoses and all orders for this visit:  Abdominal pain, left lower quadrant: White count normal. He is certainly tender in the left lower quadrant.  Will CT his abdomen today.  This is certainly presenting as diverticuliltis.  Will go ahead and send abx to  the pharmacy and change the plan if needed.   -     Cancel: CT Abdomen Pelvis W Contrast; Future -     POCT CBC -     CT Abdomen Pelvis W Contrast; Future -     amoxicillin-clavulanate (AUGMENTIN) 875-125 MG tablet; Take 1 tablet by mouth 2 (two) times daily.    The patient is advised to call or return to clinic if he does not see an improvement in symptoms, or to seek the care of the closest emergency department if he worsens with the above plan.   Deliah BostonMichael Cambridge Deleo, MHS, PA-C [facility] 04/27/2016 3:13 PM

## 2016-04-28 ENCOUNTER — Other Ambulatory Visit: Payer: Self-pay | Admitting: Physician Assistant

## 2016-10-30 ENCOUNTER — Other Ambulatory Visit: Payer: Self-pay | Admitting: Physician Assistant

## 2016-12-14 ENCOUNTER — Other Ambulatory Visit: Payer: Self-pay | Admitting: Physician Assistant

## 2016-12-15 NOTE — Telephone Encounter (Signed)
Patient already given a courtesy refill. Needs OV, lab work for additional refills.

## 2016-12-21 ENCOUNTER — Encounter: Payer: Self-pay | Admitting: Physician Assistant

## 2016-12-21 ENCOUNTER — Ambulatory Visit (INDEPENDENT_AMBULATORY_CARE_PROVIDER_SITE_OTHER): Payer: 59 | Admitting: Physician Assistant

## 2016-12-21 VITALS — BP 132/78 | HR 67 | Temp 98.1°F | Resp 16 | Ht 70.5 in | Wt 265.2 lb

## 2016-12-21 DIAGNOSIS — I10 Essential (primary) hypertension: Secondary | ICD-10-CM | POA: Diagnosis not present

## 2016-12-21 DIAGNOSIS — Z23 Encounter for immunization: Secondary | ICD-10-CM | POA: Diagnosis not present

## 2016-12-21 MED ORDER — LISINOPRIL-HYDROCHLOROTHIAZIDE 10-12.5 MG PO TABS
1.0000 | ORAL_TABLET | Freq: Every day | ORAL | 3 refills | Status: DC
Start: 2016-12-21 — End: 2017-12-08

## 2016-12-21 NOTE — Patient Instructions (Addendum)
  Looks like things are going well. Continue your diet and add that exercise. I will have your lab results shortly.     IF you received an x-ray today, you will receive an invoice from [facility]. Please contact [facility] at [phone] with questions or concerns regarding your invoice.   IF you received labwork today, you will receive an invoice from [facility]. Please contact [facility] at [phone] with questions or concerns regarding your invoice.   Our billing staff will not be able to assist you with questions regarding bills from these companies.  You will be contacted with the lab results as soon as they are available. The fastest way to get your results is to activate your My Chart account. Instructions are located on the last page of this paperwork. If you have not heard from [place] regarding the results in 2 weeks, please contact this office.

## 2016-12-21 NOTE — Progress Notes (Signed)
PRIMARY CARE AT [facility] [address], [place] [address] [phone]  Date:  12/21/2016   Name:  Kyle Price   DOB:  05/28/1961   MRN:  [id number]  PCP:  Ivar Drape, PA    History of Present Illness:  Kyle Price is a 56 y.o. male patient who presents to PCP with  Chief Complaint  Patient presents with  . Medication Refill    LISINOPRIL-HCTZ     --he took last dose today.   --cramping at the upper arm which he notices that is more significant. --cuts bleed more than normal, but it does.  --he is not hydrating well.  Water intake is about 16-24oz.   --no chest pains --he has cut out sugars, cut out soft drinks for a few weeks.  6lbs in several weeks.   --exercise has not started, plans to start now that it is warming up.  Plans to walk.     Wt Readings from Last 3 Encounters:  12/21/16 265 lb 3.2 oz (120.3 kg)  04/27/16 271 lb (122.9 kg)  11/24/15 269 lb (122 kg)     Patient Active Problem List   Diagnosis Date Noted  . Diverticulitis of colon (without mention of hemorrhage)(562.11) 06/14/2013  . HTN (hypertension) 04/11/2013  . Obesity (BMI 30-39.9) 11/23/2011  . Allergic rhinitis 11/23/2011    Past Medical History:  Diagnosis Date  . Allergy   . Anxiety   . Depression   . Diverticulitis   . IBS (irritable bowel syndrome)   . Trigger index finger of left hand   . Trigger little finger of right hand     Past Surgical History:  Procedure Laterality Date  . HERNIA REPAIR      Social History  Substance Use Topics  . Smoking status: Former Research scientist (life sciences)  . Smokeless tobacco: Never Used  . Alcohol use No    Family History  Problem Relation Age of Onset  . Heart disease Father   . Cancer Father   . Heart disease Brother   . Diabetes Brother   . Cancer Mother   . Heart disease Maternal Grandmother   . Heart disease Maternal Grandfather   . Prostate cancer Maternal Grandfather   . Cancer Paternal Grandfather     Allergies  Allergen  Reactions  . Ciprofloxacin     dizziness  . Sulfa Antibiotics     rash  . Pork-Derived Products Rash    Medication list has been reviewed and updated.  Current Outpatient Prescriptions on File Prior to Visit  Medication Sig Dispense Refill  . cetirizine (ZYRTEC) 10 MG tablet Take 10 mg by mouth daily.    Marland Kitchen ibuprofen (ADVIL,MOTRIN) 200 MG tablet Take 400 mg by mouth every 6 (six) hours as needed for pain.    Marland Kitchen lisinopril-hydrochlorothiazide (PRINZIDE,ZESTORETIC) 10-12.5 MG tablet TAKE 1 TABLET BY MOUTH DAILY( NO MORE REFILLS WITHOUT OFFICE VISIT) 30 tablet 0  . fluticasone (FLONASE) 50 MCG/ACT nasal spray Place 2 sprays into both nostrils daily. (Patient not taking: Reported on 04/27/2016) 16 g 12   No current facility-administered medications on file prior to visit.     ROS ROS otherwise unremarkable unless listed above.  Physical Examination: BP 132/78 (BP Location: Right Arm, Patient Position: Sitting, Cuff Size: Large)   Pulse 67   Temp 98.1 F (36.7 C) (Oral)   Resp 16   Ht 5' 10.5" (1.791 m)   Wt 265 lb 3.2 oz (120.3 kg)   SpO2 95%   BMI 37.51 kg/m  Ideal Body Weight: Weight in (lb) to have BMI = 25: 176.4  Physical Exam  Constitutional: He is oriented to person, place, and time. He appears well-developed and well-nourished. No distress.  HENT:  Head: Normocephalic and atraumatic.  Eyes: Conjunctivae and EOM are normal. Pupils are equal, round, and reactive to light.  Cardiovascular: Normal rate.   Pulmonary/Chest: Effort normal. No respiratory distress.  Neurological: He is alert and oriented to person, place, and time.  Skin: Skin is warm and dry. He is not diaphoretic.  Psychiatric: He has a normal mood and affect. His behavior is normal.     Assessment and Plan: Kyle Price is a 56 y.o. male who is here today  --controlled, will fill for 6 months.  rtc 6 months Essential hypertension - Plan: CMP14+EGFR, CANCELED: TSH  Need for Tdap vaccination - Plan:  Tdap vaccine greater than or equal to 7yo IM  Ivar Drape, PA-C [facility] 12/21/2016 11:17 AM

## 2016-12-22 LAB — CMP14+EGFR
ALBUMIN: 4.6 g/dL (ref 3.5–5.5)
ALT: 37 IU/L (ref 0–44)
AST: 18 IU/L (ref 0–40)
Albumin/Globulin Ratio: 1.7 (ref 1.2–2.2)
Alkaline Phosphatase: 102 IU/L (ref 39–117)
BUN / CREAT RATIO: 11 (ref 9–20)
BUN: 11 mg/dL (ref 6–24)
Bilirubin Total: 0.4 mg/dL (ref 0.0–1.2)
CALCIUM: 9.8 mg/dL (ref 8.7–10.2)
CO2: 23 mmol/L (ref 18–29)
CREATININE: 1 mg/dL (ref 0.76–1.27)
Chloride: 96 mmol/L (ref 96–106)
GFR, EST AFRICAN AMERICAN: 98 mL/min/{1.73_m2} (ref >59)
GFR, EST NON AFRICAN AMERICAN: 85 mL/min/{1.73_m2} (ref >59)
GLOBULIN, TOTAL: 2.7 g/dL (ref 1.5–4.5)
Glucose: 111 mg/dL — ABNORMAL HIGH (ref 65–99)
Potassium: 3.8 mmol/L (ref 3.5–5.2)
SODIUM: 138 mmol/L (ref 134–144)
TOTAL PROTEIN: 7.3 g/dL (ref 6.0–8.5)

## 2016-12-23 NOTE — Assessment & Plan Note (Signed)
Controlled on current prinzide.

## 2016-12-26 ENCOUNTER — Telehealth: Payer: Self-pay | Admitting: Physician Assistant

## 2016-12-26 NOTE — Telephone Encounter (Signed)
Please add hemoglobin a1c. 

## 2017-10-06 ENCOUNTER — Encounter: Payer: 59 | Admitting: Physician Assistant

## 2017-11-08 ENCOUNTER — Ambulatory Visit (INDEPENDENT_AMBULATORY_CARE_PROVIDER_SITE_OTHER): Payer: PRIVATE HEALTH INSURANCE | Admitting: Physician Assistant

## 2017-11-08 ENCOUNTER — Encounter: Payer: Self-pay | Admitting: Physician Assistant

## 2017-11-08 VITALS — BP 130/85 | HR 75 | Temp 98.6°F | Resp 16 | Ht 71.5 in | Wt 273.0 lb

## 2017-11-08 DIAGNOSIS — Z130 Encounter for screening for diseases of the blood and blood-forming organs and certain disorders involving the immune mechanism: Secondary | ICD-10-CM | POA: Diagnosis not present

## 2017-11-08 DIAGNOSIS — Z1329 Encounter for screening for other suspected endocrine disorder: Secondary | ICD-10-CM | POA: Diagnosis not present

## 2017-11-08 DIAGNOSIS — Z1211 Encounter for screening for malignant neoplasm of colon: Secondary | ICD-10-CM | POA: Diagnosis not present

## 2017-11-08 DIAGNOSIS — Z0000 Encounter for general adult medical examination without abnormal findings: Secondary | ICD-10-CM

## 2017-11-08 DIAGNOSIS — Z13220 Encounter for screening for lipoid disorders: Secondary | ICD-10-CM | POA: Diagnosis not present

## 2017-11-08 DIAGNOSIS — Z13228 Encounter for screening for other metabolic disorders: Secondary | ICD-10-CM

## 2017-11-08 NOTE — Patient Instructions (Addendum)
The office will contact you with lab results.    IF you received an x-ray today, you will receive an invoice from [facility]. Please contact [facility] at [phone] with questions or concerns regarding your invoice.   IF you received labwork today, you will receive an invoice from [facility]. Please contact [facility] at [phone] with questions or concerns regarding your invoice.   Our billing staff will not be able to assist you with questions regarding bills from these companies.  You will be contacted with the lab results as soon as they are available. The fastest way to get your results is to activate your My Chart account. Instructions are located on the last page of this paperwork. If you have not heard from us regarding the results in 2 weeks, please contact this office.    Keeping you healthy  Get these tests  Blood pressure- Have your blood pressure checked once a year by your healthcare provider.  Normal blood pressure is 120/80  Weight- Have your body mass index (BMI) calculated to screen for obesity.  BMI is a measure of body fat based on height and weight. You can also calculate your own BMI at [email or web address].nhlbisuport.[email or web address]/[email or web address]/.  Cholesterol- Have your cholesterol checked every year.  Diabetes- Have your blood sugar checked regularly if you have high blood pressure, high cholesterol, have a family history of diabetes or if you are overweight.  Screening for Colon Cancer- Colonoscopy starting at age 56.  Screening may begin sooner depending on your family history and other health conditions. Follow up colonoscopy as directed by your Gastroenterologist.  Screening for Prostate Cancer- Both blood work (PSA) and a rectal exam help screen for Prostate Cancer.  Screening begins at age 56 with African-American men and at age 56 with Caucasian men.  Screening may begin sooner depending on your family history.  Take these medicines  Aspirin- One aspirin daily can help  prevent Heart disease and Stroke.  Flu shot- Every fall.  Tetanus- Every 10 years.  Zostavax- Once after the age of 56 to prevent Shingles.  Pneumonia shot- Once after the age of 56; if you are younger than 3165, ask your healthcare provider if you need a Pneumonia shot.  Take these steps  Don't smoke- If you do smoke, talk to your doctor about quitting.  For tips on how to quit, go to www.smokefree.gov or call 1-800-QUIT-NOW.  Be physically active- Exercise 5 days a week for at least 30 minutes.  If you are not already physically active start slow and gradually work up to 30 minutes of moderate physical activity.  Examples of moderate activity include walking briskly, mowing the yard, dancing, swimming, bicycling, etc.  Eat a healthy diet- Eat a variety of healthy food such as fruits, vegetables, low fat milk, low fat cheese, yogurt, lean meant, poultry, fish, beans, tofu, etc. For more information go to www.thenutritionsource.org  Drink alcohol in moderation- Limit alcohol intake to less than two drinks a day. Never drink and drive.  Dentist- Brush and floss twice daily; visit your dentist twice a year.  Depression- Your emotional health is as important as your physical health. If you're feeling down, or losing interest in things you would normally enjoy please talk to your healthcare provider.  Eye exam- Visit your eye doctor every year.  Safe sex- If you may be exposed to a sexually transmitted infection, use a condom.  Seat belts- Seat belts can save your life; always wear one.  Smoke/Carbon Clinical cytogeneticistMonoxide detectors- Therapist, sportsThese detectors  need to be installed on the appropriate level of your home.  Replace batteries at least once a year.  Skin cancer- When out in the sun, cover up and use sunscreen 15 SPF or higher.  Violence- If anyone is threatening you, please tell your healthcare provider.  Living Will/ Health care power of attorney- Speak with your healthcare provider and family.

## 2017-11-08 NOTE — Progress Notes (Signed)
PRIMARY CARE AT [facility] [address], [place] [address] [phone]  Date:  11/08/2017   Name:  Kyle Price   DOB:  Sep 27, 1960   MRN:  [id number]  PCP:  Joretta Bachelor, PA    History of Present Illness:  Kyle Price is a 57 y.o. male patient who presents to PCP with  Chief Complaint  Patient presents with  . Annual Exam  . Leg Pain    Left side, x 2 years  . Diverticulitis      URINATION: no dysuria, hematuria, or frequency  BM: goes from constipation to diarrhea consistently for years.  Hx of diverticulitis.  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   He will have pain at his left inner knee, and will get very tender and radiate up to his left hip.  He drinks water minimally.  Has no trauma to note.  May have spasms at the lateral of thigh, however this goes away.  His hydration is very minimal 1.5 years without work. Will binge eat at times.     Health Maintenance  Topic Date Due  . Hepatitis C Screening  05/19/1961  . HIV Screening  07/31/1977  . COLONOSCOPY  07/31/2012  . INFLUENZA VACCINE  07/11/2018 (Originally 04/12/2017)  . TETANUS/TDAP  12/22/2026    Patient Active Problem List   Diagnosis Date Noted  . Diverticulitis of colon (without mention of hemorrhage)(562.11) 06/14/2013  . HTN (hypertension) 04/11/2013  . Obesity (BMI 30-39.9) 11/23/2011  . Allergic rhinitis 11/23/2011    Past Medical History:  Diagnosis Date  . Allergy   . Anxiety   . Depression   . Diverticulitis   . IBS (irritable bowel syndrome)   . Trigger index finger of left hand   . Trigger little finger of right hand     Past Surgical History:  Procedure Laterality Date  . HERNIA REPAIR      Social History   Tobacco Use  . Smoking status: Former Research scientist (life sciences)  . Smokeless tobacco: Never Used  Substance Use Topics  . Alcohol use: No  . Drug use: No    Family History   Problem Relation Age of Onset  . Heart disease Father   . Cancer Father   . Heart disease Brother   . Diabetes Brother   . Cancer Mother   . Heart disease Maternal Grandmother   . Heart disease Maternal Grandfather   . Prostate cancer Maternal Grandfather   . Cancer Paternal Grandfather     Allergies  Allergen Reactions  . Ciprofloxacin     dizziness  . Sulfa Antibiotics     rash  . Pork-Derived Products Rash    Medication list has been reviewed and updated.  Current Outpatient Medications on File Prior to Visit  Medication Sig Dispense Refill  . ibuprofen (ADVIL,MOTRIN) 200 MG tablet Take 400 mg by mouth every 6 (six) hours as needed for pain.    Marland Kitchen lisinopril-hydrochlorothiazide (PRINZIDE,ZESTORETIC) 10-12.5 MG tablet Take 1 tablet by mouth daily. 90 tablet 3  . cetirizine (ZYRTEC) 10 MG tablet Take 10 mg by mouth daily.    . fluticasone (FLONASE) 50 MCG/ACT nasal spray Place 2 sprays into both nostrils daily. (Patient not taking: Reported on 04/27/2016) 16 g 12   No current facility-administered medications on file prior to visit.     Review of Systems  Constitutional: Negative  for chills and fever.  HENT: Negative for ear discharge, ear pain and sore throat.   Eyes: Negative for blurred vision and double vision.  Respiratory: Negative for cough, shortness of breath and wheezing.   Cardiovascular: Negative for chest pain, palpitations and leg swelling.  Gastrointestinal: Negative for diarrhea, nausea and vomiting.  Genitourinary: Negative for dysuria, frequency and hematuria.  Skin: Negative for itching and rash.  Neurological: Negative for dizziness and headaches.   ROS otherwise unremarkable unless listed above.  Physical Examination: BP 130/85   Pulse 75   Temp 98.6 F (37 C) (Oral)   Resp 16   Ht 5' 11.5" (1.816 m)   Wt 273 lb (123.8 kg)   SpO2 95%   BMI 37.55 kg/m  Ideal Body Weight: Weight in (lb) to have BMI = 25: 181.4  Physical Exam   Constitutional: He is oriented to person, place, and time. He appears well-developed and well-nourished. No distress.  HENT:  Head: Normocephalic and atraumatic.  Right Ear: Tympanic membrane, external ear and ear canal normal.  Left Ear: Tympanic membrane, external ear and ear canal normal.  Eyes: Conjunctivae and EOM are normal. Pupils are equal, round, and reactive to light.  Cardiovascular: Normal rate and regular rhythm. Exam reveals no friction rub.  No murmur heard. Pulmonary/Chest: Effort normal. No respiratory distress. He has no wheezes.  Abdominal: Soft. Bowel sounds are normal. He exhibits no distension and no mass. There is no tenderness.  Musculoskeletal: Normal range of motion. He exhibits no edema or tenderness.       Left knee: He exhibits normal range of motion, no swelling, no ecchymosis, no LCL laxity, normal patellar mobility and no MCL laxity. No tenderness found. No medial joint line, no lateral joint line and no patellar tendon tenderness noted.  Neurological: He is alert and oriented to person, place, and time. He displays normal reflexes.  Skin: Skin is warm and dry. He is not diaphoretic.  Psychiatric: He has a normal mood and affect. His behavior is normal.   Assessment and Plan: Kyle Price is a 57 y.o. male who is here today for cc of  Chief Complaint  Patient presents with  . Annual Exam  . Leg Pain    Left side, x 2 years  . Diverticulitis  offered radiograph of the knee to start.  He declines at this time.    Annual physical exam - Plan: Ambulatory referral to Gastroenterology, CBC, CMP14+EGFR, TSH, Lipid panel  Special screening for malignant neoplasms, colon - Plan: Ambulatory referral to Gastroenterology  Screening for deficiency anemia - Plan: CBC  Screening for lipid disorders - Plan: Lipid panel  Screening for metabolic disorder - Plan: CMP14+EGFR  Screening for thyroid disorder - Plan: TSH  Ivar Drape, PA-C [facility] 2/27/20199:51 AM

## 2017-11-09 LAB — CMP14+EGFR
A/G RATIO: 1.8 (ref 1.2–2.2)
ALT: 47 IU/L — ABNORMAL HIGH (ref 0–44)
AST: 23 IU/L (ref 0–40)
Albumin: 4.6 g/dL (ref 3.5–5.5)
Alkaline Phosphatase: 109 IU/L (ref 39–117)
BUN / CREAT RATIO: 14 (ref 9–20)
BUN: 14 mg/dL (ref 6–24)
Bilirubin Total: 0.6 mg/dL (ref 0.0–1.2)
CALCIUM: 9.5 mg/dL (ref 8.7–10.2)
CO2: 23 mmol/L (ref 20–29)
Chloride: 96 mmol/L (ref 96–106)
Creatinine, Ser: 0.98 mg/dL (ref 0.76–1.27)
GFR, EST AFRICAN AMERICAN: 100 mL/min/{1.73_m2} (ref >59)
GFR, EST NON AFRICAN AMERICAN: 86 mL/min/{1.73_m2} (ref >59)
GLOBULIN, TOTAL: 2.6 g/dL (ref 1.5–4.5)
Glucose: 198 mg/dL — ABNORMAL HIGH (ref 65–99)
POTASSIUM: 3.7 mmol/L (ref 3.5–5.2)
SODIUM: 134 mmol/L (ref 134–144)
TOTAL PROTEIN: 7.2 g/dL (ref 6.0–8.5)

## 2017-11-09 LAB — CBC
HEMATOCRIT: 45.2 % (ref 37.5–51.0)
Hemoglobin: 15.2 g/dL (ref 13.0–17.7)
MCH: 29.2 pg (ref 26.6–33.0)
MCHC: 33.6 g/dL (ref 31.5–35.7)
MCV: 87 fL (ref 79–97)
Platelets: 236 10*3/uL (ref 150–379)
RBC: 5.2 x10E6/uL (ref 4.14–5.80)
RDW: 13.7 % (ref 12.3–15.4)
WBC: 5.5 10*3/uL (ref 3.4–10.8)

## 2017-11-09 LAB — LIPID PANEL
CHOL/HDL RATIO: 6.2 ratio — AB (ref 0.0–5.0)
Cholesterol, Total: 191 mg/dL (ref 100–199)
HDL: 31 mg/dL — ABNORMAL LOW (ref >39)
LDL CALC: 102 mg/dL — AB (ref 0–99)
Triglycerides: 291 mg/dL — ABNORMAL HIGH (ref 0–149)
VLDL Cholesterol Cal: 58 mg/dL — ABNORMAL HIGH (ref 5–40)

## 2017-11-09 LAB — TSH: TSH: 1.3 u[IU]/mL (ref 0.450–4.500)

## 2017-11-21 ENCOUNTER — Encounter: Payer: Self-pay | Admitting: Gastroenterology

## 2017-11-21 ENCOUNTER — Telehealth: Payer: Self-pay | Admitting: Physician Assistant

## 2017-11-21 ENCOUNTER — Telehealth: Payer: Self-pay | Admitting: Gastroenterology

## 2017-11-21 NOTE — Telephone Encounter (Signed)
ERROR

## 2017-11-21 NOTE — Telephone Encounter (Signed)
Called pt to remind them of their appt tomorrow. Advised them of the time, building number, early arrival and late policy. °

## 2017-11-22 ENCOUNTER — Encounter: Payer: Self-pay | Admitting: Physician Assistant

## 2017-11-22 ENCOUNTER — Ambulatory Visit (INDEPENDENT_AMBULATORY_CARE_PROVIDER_SITE_OTHER): Payer: PRIVATE HEALTH INSURANCE | Admitting: Physician Assistant

## 2017-11-22 VITALS — BP 125/74 | HR 79 | Temp 98.1°F | Resp 18 | Ht 71.5 in | Wt 272.2 lb

## 2017-11-22 DIAGNOSIS — F411 Generalized anxiety disorder: Secondary | ICD-10-CM

## 2017-11-22 DIAGNOSIS — Z8659 Personal history of other mental and behavioral disorders: Secondary | ICD-10-CM | POA: Diagnosis not present

## 2017-11-22 MED ORDER — BUPROPION HCL ER (SR) 150 MG PO TB12: 150.0000 mg | ORAL_TABLET | Freq: Two times a day (BID) | ORAL | 1 refills | Status: DC

## 2017-11-22 NOTE — Progress Notes (Signed)
[facility] CARE AT [facility] [address], [place] [address] [phone]  Date:  11/22/2017   Name:  Kyle Price   DOB:  05/16/1961   MRN:  [id number]  PCP:  Kyle Price, Kyle Lassalle D, PA    History of Present Illness:  Kyle Price is a 57 y.o. male patient who presents to PCP with  Chief Complaint  Patient presents with  . new medication    wants to be put back on ADHD; was on it years ago; recent life choices wants him back on     Patient is here to be restarted on ADHD medication. Patient was diagnosed with ADHD about 1985.  He reports this is been difficult to manage in the last 6 years. His symptoms that he complains about is a compulsive behavior.  This is in the form of checking the mail.  He states that he has a desire to always have male.  Patient reports that at one point he was put on Concerta.  This was good until he started developing panic attacks.  He was then discontinued off of this medication.  He had read on at one time but this he reported was so thin that they had stopped it at one.  The Concerta had been more advantageous in that way of having continuous control of the ADHD.  He has also had Wellbutrin.  He does not recall why this was stopped but thinks that it may have been due to an expense.  To note patient has been out of work for years.  He continues to look for employment.  Former employment was in Consulting civil engineerT.  Does not want to do this anymore.    Patient Active Problem List   Diagnosis Date Noted  . Diverticulitis of colon (without mention of hemorrhage)(562.11) 06/14/2013  . HTN (hypertension) 04/11/2013  . Obesity (BMI 30-39.9) 11/23/2011  . Allergic rhinitis 11/23/2011    Past Medical History:  Diagnosis Date  . Allergy   . Anxiety   . Depression   . Diverticulitis   . IBS (irritable bowel syndrome)   . Trigger index finger of left hand   . Trigger little finger of right hand     Past Surgical History:  Procedure Laterality Date  . HERNIA REPAIR       Social History   Tobacco Use  . Smoking status: Former Games developermoker  . Smokeless tobacco: Never Used  Substance Use Topics  . Alcohol use: No  . Drug use: No    Family History  Problem Relation Age of Onset  . Heart disease Father   . Cancer Father   . Heart disease Brother   . Diabetes Brother   . Cancer Mother   . Heart disease Maternal Grandmother   . Heart disease Maternal Grandfather   . Prostate cancer Maternal Grandfather   . Cancer Paternal Grandfather     Allergies  Allergen Reactions  . Ciprofloxacin     dizziness  . Sulfa Antibiotics     rash  . Pork-Derived Products Rash    Medication list has been reviewed and updated.  Current Outpatient Medications on File Prior to Visit  Medication Sig Dispense Refill  . ibuprofen (ADVIL,MOTRIN) 200 MG tablet Take 400 mg by mouth every 6 (six) hours as needed for pain.    Marland Kitchen. lisinopril-hydrochlorothiazide (PRINZIDE,ZESTORETIC) 10-12.5 MG tablet Take 1 tablet by mouth daily. 90 tablet 3   No current facility-administered medications on file prior to visit.     ROS ROS  otherwise unremarkable unless listed above.  Physical Examination: BP 125/74   Pulse 79   Temp 98.1 F (36.7 C) (Oral)   Resp 18   Ht 5' 11.5" (1.816 m)   Wt 272 lb 3.2 oz (123.5 kg)   SpO2 96%   BMI 37.44 kg/m  Ideal Body Weight: Weight in (lb) to have BMI = 25: 181.4  Physical Exam  Constitutional: He is oriented to person, place, and time. He appears well-developed and well-nourished. No distress.  HENT:  Head: Normocephalic and atraumatic.  Eyes: Pupils are equal, round, and reactive to light. Conjunctivae and EOM are normal.  Cardiovascular: Normal rate, regular rhythm and intact distal pulses. Exam reveals no friction rub.  No murmur heard. Pulmonary/Chest: Effort normal. No respiratory distress.  Neurological: He is alert and oriented to person, place, and time.  Skin: Skin is warm and dry. Capillary refill takes less than 2  seconds. He is not diaphoretic.  Psychiatric: He has a normal mood and affect. His behavior is normal.     Assessment and Plan: Kyle Price is a 57 y.o. male who is here today for cc of  Chief Complaint  Patient presents with  . new medication    wants to be put back on ADHD; was on it years ago; recent life choices wants him back on  --I am starting him on Wellbutrin at this time.  He would like a referral to psychiatry for this as well and I am fine with that.  He will follow-up with psychiatry for follow-up 4-6 weeks regarding the efficacy of this medication. History of ADHD - Plan: Ambulatory referral to Psychiatry  Anxiety state - Plan: Ambulatory referral to Psychiatry  History of depression - Plan: Ambulatory referral to Psychiatry  Kyle Platt, PA-C [facility] 3/29/20197:42 AM

## 2017-11-22 NOTE — Patient Instructions (Addendum)
Please take medication as prescribed.     [facility] [address] [place] , [place] [address]   Shanon RosserBarbara Farran [phone]  Maris BergerKathy Kirstner [phone]  Marco CollieSusan Kroll-Smith [phone]   [facility] ([address] AmatoKincaid, Ernest Beckey RutterMcCoy, Heather Joycelyn SchmidKitchens, Karla [place]) - [phone]  [facility](Julie [address]itt) - [phone]  [facility] - [phone]  [facility] - [phone]  Buena IrishBob Mylan - [phone]  [facility]  - [phone] (do not file insurance)  IF you received an x-ray today, you will receive an invoice from [facility]. Please contact [facility] at [phone] with questions or concerns regarding your invoice.   IF you received labwork today, you will receive an invoice from [facility]. Please contact [facility] at [phone] with questions or concerns regarding your invoice.   Our billing staff will not be able to assist you with questions regarding bills from these companies.  You will be contacted with the lab results as soon as they are available. The fastest way to get your results is to activate your My Chart account. Instructions are located on the last page of this paperwork. If you have not heard from us regarding the results in 2 weeks, please contact this office.

## 2017-11-24 ENCOUNTER — Encounter: Payer: Self-pay | Admitting: Physician Assistant

## 2017-12-08 ENCOUNTER — Other Ambulatory Visit: Payer: Self-pay | Admitting: Physician Assistant

## 2017-12-12 ENCOUNTER — Encounter: Payer: Self-pay | Admitting: Physician Assistant

## 2017-12-12 DIAGNOSIS — E782 Mixed hyperlipidemia: Secondary | ICD-10-CM

## 2017-12-20 ENCOUNTER — Encounter: Payer: Self-pay | Admitting: Physician Assistant

## 2017-12-23 MED ORDER — OMEGA-3-ACID ETHYL ESTERS 1 G PO CAPS: 2.0000 g | ORAL_CAPSULE | Freq: Two times a day (BID) | ORAL | 2 refills | Status: DC

## 2018-01-04 ENCOUNTER — Encounter: Payer: Self-pay | Admitting: Physician Assistant

## 2018-01-04 DIAGNOSIS — Z658 Other specified problems related to psychosocial circumstances: Secondary | ICD-10-CM

## 2018-01-04 MED ORDER — BUPROPION HCL ER (SR) 150 MG PO TB12: 150.0000 mg | ORAL_TABLET | Freq: Two times a day (BID) | ORAL | 1 refills | Status: DC

## 2018-01-26 ENCOUNTER — Other Ambulatory Visit: Payer: Self-pay

## 2018-01-26 ENCOUNTER — Ambulatory Visit (AMBULATORY_SURGERY_CENTER): Payer: Self-pay | Admitting: *Deleted

## 2018-01-26 ENCOUNTER — Telehealth: Payer: Self-pay | Admitting: *Deleted

## 2018-01-26 VITALS — Ht 71.0 in | Wt 266.8 lb

## 2018-01-26 DIAGNOSIS — Z1211 Encounter for screening for malignant neoplasm of colon: Secondary | ICD-10-CM

## 2018-01-26 MED ORDER — NA SULFATE-K SULFATE-MG SULF 17.5-3.13-1.6 GM/177ML PO SOLN: 1.0000 [IU] | Freq: Once | ORAL | 0 refills | Status: AC

## 2018-01-26 NOTE — Telephone Encounter (Signed)
Dr. Christella Hartigan, he also complains of having ribbon stools at times.

## 2018-01-26 NOTE — Telephone Encounter (Signed)
Cancel upcoming colonoscopy. He needs NGI in the office first (last visit 5 years ago) to discuss his issues, decide best next steps. Thanks

## 2018-01-26 NOTE — Telephone Encounter (Signed)
Dr. Christella Hartigan,  Did you want the appointment in office with you?  You do not have anything available until 7/17 or is one of the extenders ok?

## 2018-01-26 NOTE — Telephone Encounter (Signed)
Dr. Christella Hartigan,  Patient had his pre visit with me this morning and he is concerned about his chronic GI issues.  He states he has had diverticulitis several times in the past,  was seen at urgent care (OV note 04/27/2016) and treated with antibiotics.  He was scheduled for a CT scan but never had it done due to no insurance at the time.  He also was scheduled to have a colonoscopy (not sure of the place he was scheduled) 5 years ago and did not have it done. He complains of alternating diarrhea and constipation and abdominal cramping.   He was wondering if he needs to be seen in the office or have a CT scan done prior to his colonoscopy.  Please advise,  Thank you.  B.Lillie Bollig, CMA  PV

## 2018-01-26 NOTE — Progress Notes (Addendum)
No egg or soy allergy known to patient  No issues with past sedation with any surgeries  or procedures, no intubation problems  No diet pills per patient No home 02 use per patient  No blood thinners per patient  Pt denies issues with constipation on and off along with diarrhea  GI issues. No A fib or A flutter  EMMI video sent to pt's e mail pt. Declined  T.E. Sent to Dr. Christella Hartigan regarding his chronic GI issues (diverticulitis and alternating diarrhea and constipation).  He is concerned about having colonoscopy because he thinks he may have a blockage.  He saw urgent care with diverticulitis and was scheduled for CT which he did not have done due to no insurance.  Treated with antibiotics.  He also had colonoscopy scheduled 5 years ago but did not have this done either.  B.Richerd Grime, CMA

## 2018-01-30 ENCOUNTER — Ambulatory Visit: Payer: PRIVATE HEALTH INSURANCE | Admitting: Nurse Practitioner

## 2018-02-01 ENCOUNTER — Ambulatory Visit (INDEPENDENT_AMBULATORY_CARE_PROVIDER_SITE_OTHER): Payer: PRIVATE HEALTH INSURANCE | Admitting: Physician Assistant

## 2018-02-01 ENCOUNTER — Encounter: Payer: Self-pay | Admitting: Physician Assistant

## 2018-02-01 VITALS — BP 132/80 | HR 80 | Ht 70.0 in | Wt 267.0 lb

## 2018-02-01 DIAGNOSIS — K219 Gastro-esophageal reflux disease without esophagitis: Secondary | ICD-10-CM

## 2018-02-01 DIAGNOSIS — R194 Change in bowel habit: Secondary | ICD-10-CM

## 2018-02-01 DIAGNOSIS — Z1211 Encounter for screening for malignant neoplasm of colon: Secondary | ICD-10-CM

## 2018-02-01 NOTE — Patient Instructions (Signed)
You have been scheduled for a colonoscopy. Please follow written instructions given to you at your visit today.  Please pick up your prep supplies at the pharmacy within the next 1-3 days. If you use inhalers (even only as needed), please bring them with you on the day of your procedure. Your physician has requested that you go to www.startemmi.com and enter the access code given to you at your visit today. This web site gives a general overview about your procedure. However, you should still follow specific instructions given to you by our office regarding your preparation for the procedure.  Please purchase the following medications over the counter and take as directed: Omeprazole 20 mg daily for 2 weeks  We have given you a high fiber diet handout. Please strive to have 25-30 grams of fiber daily.

## 2018-02-01 NOTE — Progress Notes (Signed)
I agree with the above note, plan 

## 2018-02-01 NOTE — Progress Notes (Signed)
Chief Complaint: History of diverticulitis, alternating bowel movements  HPI:    Kyle Price is a 57 year old male with a past medical history as listed below, known to Dr. Christella Hartigan, who was referred to me by Garnetta Buddy, PA for a complaint of recent diverticulitis.      05/15/2013 office visit Dr. Christella Hartigan.  Discussed concerns regarding IBS.  At that time it was recommended he have a screening colonoscopy.  Recommend he try daily fiber supplement for alternating bowel habits.    01/26/2018 upcoming colonoscopy canceled due to concerns raised at previsit regarding history of diverticulitis and alternating diarrhea and constipation.    Today, explains that he has had GI problems since he was 57 years old.  Describes intermittent reflux which was worse before he turned 57 years old.  Now he only has intermittent breakthrough symptoms when he "eats the wrong thing".  Also some lingering epigastric pain currently after having some spicy sausage last night to eat for dinner.  Eating anything fatty makes this worse.    Also history of diverticulitis treated 3 times over the past 3 years, most recently a year and a half ago.  Always relieved by over-the-counter antibiotics.  No previous imaging.    Also alternating from constipation to loose stool pending diet.  Does describe that his stool always comes out and what looks like "ribbons".  Patient has tried fiber in the past which did make this better, but he recently lost his job 2 years ago and cannot afford to buy fiber supplements anymore    Denies fever, chills, blood in his stool, weight loss, anorexia, nausea, vomiting or symptoms that awaken him at night.  Past Medical History:  Diagnosis Date  . Allergy   . Anxiety   . Depression   . Diverticulitis   . GERD (gastroesophageal reflux disease)   . Hyperlipidemia   . Hypertension   . IBS (irritable bowel syndrome)   . Trigger index finger of left hand   . Trigger little finger of right hand      Past Surgical History:  Procedure Laterality Date  . HERNIA REPAIR      Current Outpatient Medications  Medication Sig Dispense Refill  . buPROPion (WELLBUTRIN SR) 150 MG 12 hr tablet Take 1 tablet (150 mg total) by mouth 2 (two) times daily. 60 tablet 1  . ibuprofen (ADVIL,MOTRIN) 200 MG tablet Take 400 mg by mouth every 6 (six) hours as needed for pain.    Marland Kitchen lisinopril-hydrochlorothiazide (PRINZIDE,ZESTORETIC) 10-12.5 MG tablet TAKE 1 TABLET BY MOUTH DAILY 90 tablet 0   No current facility-administered medications for this visit.     Allergies as of 02/01/2018 - Review Complete 01/26/2018  Allergen Reaction Noted  . Ciprofloxacin  06/05/2013  . Sulfa antibiotics  11/23/2011  . Pork-derived products Rash 04/07/2013    Family History  Problem Relation Age of Onset  . Heart disease Father   . Cancer Father   . Heart disease Brother   . Diabetes Brother   . Other Brother   . Cancer Mother   . Heart disease Maternal Grandmother   . Heart disease Maternal Grandfather   . Prostate cancer Maternal Grandfather   . Cancer Paternal Grandfather   . Colon cancer Neg Hx   . Colon polyps Neg Hx   . Esophageal cancer Neg Hx   . Rectal cancer Neg Hx   . Stomach cancer Neg Hx     Social History   Socioeconomic History  .  Marital status: Married    Spouse name: Not on file  . Number of children: 1  . Years of education: Not on file  . Highest education level: Not on file  Occupational History  . Occupation: Catering manager: [employer]  Social Needs  . Financial resource strain: Not on file  . Food insecurity:    Worry: Not on file    Inability: Not on file  . Transportation needs:    Medical: Not on file    Non-medical: Not on file  Tobacco Use  . Smoking status: Former Smoker    Types: Cigarettes  . Smokeless tobacco: Never Used  . Tobacco comment: quit 18 years ago  Substance and Sexual Activity  . Alcohol use: No  . Drug use: No    . Sexual activity: Yes    Birth control/protection: Condom    Comment: number of sex partners in the last 12 months 1  Lifestyle  . Physical activity:    Days per week: Not on file    Minutes per session: Not on file  . Stress: Not on file  Relationships  . Social connections:    Talks on phone: Not on file    Gets together: Not on file    Attends religious service: Not on file    Active member of club or organization: Not on file    Attends meetings of clubs or organizations: Not on file    Relationship status: Not on file  . Intimate partner violence:    Fear of current or ex partner: Not on file    Emotionally abused: Not on file    Physically abused: Not on file    Forced sexual activity: Not on file  Other Topics Concern  . Not on file  Social History Narrative  . Not on file    Review of Systems:    Constitutional: No weight loss, fever or chills Skin: No rash  Cardiovascular: No chest pain Respiratory: No SOB  Gastrointestinal: See HPI and otherwise negative Genitourinary: No dysuria Neurological: No headache, dizziness or syncope Musculoskeletal: No new muscle or joint pain Hematologic: No bleeding Psychiatric: +anxiety   Physical Exam:  Vital signs: BP 132/80 (BP Location: Left Arm, Patient Position: Sitting, Cuff Size: Normal)   Pulse 80   Ht  (1.778 m) Comment: height measured without shoes  Wt 267 lb (121.1 kg)   BMI 38.31 kg/m    Constitutional:   Pleasant Caucasian male appears to be in NAD, Well developed, Well nourished, alert and cooperative Head:  Normocephalic and atraumatic. Eyes:   PEERL, EOMI. No icterus. Conjunctiva pink. Ears:  Normal auditory acuity. Neck:  Supple Throat: Oral cavity and pharynx without inflammation, swelling or lesion.  Respiratory: Respirations even and unlabored. Lungs clear to auscultation bilaterally.   No wheezes, crackles, or rhonchi.  Cardiovascular: Normal S1, S2. No MRG. Regular rate and rhythm. No  peripheral edema, cyanosis or pallor.  Gastrointestinal:  Soft, nondistended, mild epigastric ttp. No rebound or guarding. Normal bowel sounds. No appreciable masses or hepatomegaly. Rectal:  Not performed.  Msk:  Symmetrical without gross deformities. Without edema, no deformity or joint abnormality.  Neurologic:  Alert and  oriented x4;  grossly normal neurologically.  Skin:   Dry and intact without significant lesions or rashes. Psychiatric: Demonstrates good judgement and reason without abnormal affect or behaviors.  MOST RECENT LABS AND IMAGING: CBC    Component Value Date/Time   WBC 5.5 11/08/2017  1008   WBC 7.3 04/27/2016 1430   WBC 6.0 08/05/2015 1019   RBC 5.20 11/08/2017 1008   RBC 5.20 04/27/2016 1430   RBC 4.79 08/05/2015 1019   HGB 15.2 11/08/2017 1008   HCT 45.2 11/08/2017 1008   PLT 236 11/08/2017 1008   MCV 87 11/08/2017 1008   MCH 29.2 11/08/2017 1008   MCH 30.3 04/27/2016 1430   MCH 29.2 08/05/2015 1019   MCHC 33.6 11/08/2017 1008   MCHC 36.1 (A) 04/27/2016 1430   MCHC 32.4 08/05/2015 1019   RDW 13.7 11/08/2017 1008   LYMPHSABS 1.8 04/07/2013 2216   MONOABS 0.5 04/07/2013 2216   EOSABS 0.2 04/07/2013 2216   BASOSABS 0.0 04/07/2013 2216    CMP     Component Value Date/Time   NA 134 11/08/2017 1008   K 3.7 11/08/2017 1008   CL 96 11/08/2017 1008   CO2 23 11/08/2017 1008   GLUCOSE 198 (H) 11/08/2017 1008   GLUCOSE 73 11/24/2015 1714   BUN 14 11/08/2017 1008   CREATININE 0.98 11/08/2017 1008   CREATININE 1.33 11/24/2015 1714   CALCIUM 9.5 11/08/2017 1008   PROT 7.2 11/08/2017 1008   ALBUMIN 4.6 11/08/2017 1008   AST 23 11/08/2017 1008   ALT 47 (H) 11/08/2017 1008   ALKPHOS 109 11/08/2017 1008   BILITOT 0.6 11/08/2017 1008   GFRNONAA 86 11/08/2017 1008   GFRAA 100 11/08/2017 1008    Assessment: 1.  Alternating bowel habits: Between ribbonlike stools and diarrhea; most likely IBS +/- internal hemorrhoids 2.  History of diverticulitis: 3 flares  in the past 3 years or so, last year and a half ago, needs colonoscopy 3.  Screening for colorectal cancer: Patient has never had a screening colonoscopy and is overdue now  Plan: 1.  Again recommend increasing fiber in his diet, 25-35 g/day with the use of a fiber supplement or through his diet. 2.  Recommend patient start over-the-counter Omeprazole 20 mg daily and take for 2 weeks. 3.  Patient was rescheduled for his screening colonoscopy with Dr. Christella Hartigan in [facility].  Did discuss risk, benefits, limitations and alternatives and the patient agrees to proceed. 4.  Patient to follow in clinic per recommendations from Dr. Christella Hartigan after time of procedure.  Hyacinth Meeker, PA-C [facility] 02/01/2018, 10:05 AM  Cc: Garnetta Buddy, PA

## 2018-02-09 ENCOUNTER — Encounter: Payer: 59 | Admitting: Gastroenterology

## 2018-02-22 ENCOUNTER — Telehealth: Payer: Self-pay | Admitting: Physician Assistant

## 2018-02-22 NOTE — Telephone Encounter (Signed)
Copied from CRM [phone]. Topic: Inquiry >> Feb 22, 2018 12:10 PM Stephannie LiSimmons, Coulter Oldaker L, NT wrote: Reason for CRM: the pharmacy called and said the patient is requesting a prescription for onega 3 1 gram [id number] [place], [place] - [address] [phone] (Phone) [phone] (Fax

## 2018-02-22 NOTE — Telephone Encounter (Signed)
LMOVM for pt to make appt with provider ? Deliah BostonMichael Clark to cover for Anheuser-BuschStephanie English. Per Stephanie's note, he was to take 1 tab in am and pm and return for follow up.

## 2018-02-23 NOTE — Telephone Encounter (Signed)
Please schedule OV.  

## 2018-02-23 NOTE — Telephone Encounter (Signed)
Sounds like we should see him back so I can meet him and refill meds.

## 2018-03-01 NOTE — Telephone Encounter (Signed)
mychart message sent to pt about making an appt °

## 2018-03-06 ENCOUNTER — Ambulatory Visit (INDEPENDENT_AMBULATORY_CARE_PROVIDER_SITE_OTHER): Payer: PRIVATE HEALTH INSURANCE | Admitting: Physician Assistant

## 2018-03-06 ENCOUNTER — Other Ambulatory Visit: Payer: Self-pay

## 2018-03-06 ENCOUNTER — Encounter: Payer: Self-pay | Admitting: Physician Assistant

## 2018-03-06 VITALS — BP 126/79 | Temp 98.0°F | Resp 16 | Ht 72.05 in | Wt 264.0 lb

## 2018-03-06 DIAGNOSIS — E785 Hyperlipidemia, unspecified: Secondary | ICD-10-CM | POA: Diagnosis not present

## 2018-03-06 DIAGNOSIS — Z87898 Personal history of other specified conditions: Secondary | ICD-10-CM | POA: Diagnosis not present

## 2018-03-06 DIAGNOSIS — Z8659 Personal history of other mental and behavioral disorders: Secondary | ICD-10-CM | POA: Diagnosis not present

## 2018-03-06 DIAGNOSIS — I10 Essential (primary) hypertension: Secondary | ICD-10-CM | POA: Diagnosis not present

## 2018-03-06 DIAGNOSIS — Z658 Other specified problems related to psychosocial circumstances: Secondary | ICD-10-CM | POA: Diagnosis not present

## 2018-03-06 DIAGNOSIS — E669 Obesity, unspecified: Secondary | ICD-10-CM

## 2018-03-06 MED ORDER — LISINOPRIL-HYDROCHLOROTHIAZIDE 10-12.5 MG PO TABS: 1.0000 | ORAL_TABLET | Freq: Every day | ORAL | 1 refills | Status: DC

## 2018-03-06 MED ORDER — BUPROPION HCL ER (SR) 150 MG PO TB12: 150.0000 mg | ORAL_TABLET | Freq: Two times a day (BID) | ORAL | 1 refills | Status: DC

## 2018-03-06 NOTE — Progress Notes (Signed)
MRN: [id number] DOB: 25-Sep-1960  Subjective:   Kyle Price is a 57 y.o. male presenting to transfer care from [place] and medication refills. Pt is fasting today.   Chronic medical conditions: VFI:EPPIRJJOA managed with lisinopril-hctz 10-12.64m daily. Patient is not checking blood pressure at home. Denies lightheadedness, dizziness, chronic headache, double vision, chest pain, shortness of breath, heart racing, palpitations, nausea, vomiting, abdominal pain, hematuria, lower leg swelling. Lifestyle: Diet consists of anything: cannot eat raw fruits and veggies. Has negative feelings toward these but can eat them cooked. He cooks most meals at home. Does not eat out often. Eats lots of red meat and white meat.  Avoiding excessive salt intake. Drinks mostly water, gave up sweet tea two weeks ago.No regular exercise. Denies smoking and alcohol use. Alcoholism runs in family.  Dyslipidemia: Was taking omega-3 fatty acids BID. Took for 3 weeks but was really upsetting to stomach so stopped.  Last lipid panel was 10/2017.  ADHD and depression: Started on Wellbutrin in 11/2017. Was referred to psychiatry for attention testing.  Referral was sent to neuropsychiatric care center 3/22. Did not go due to financial issues. Has not had a job in 226 months Impulsivity seems to be way better controlled.  Would like to see a cSocial worker  Has had success with this in the past.  Has also had issues from his childhood that he wants to do with.  Denies SI or HI.  IBS: Last saw GI in 01/2018.  Was placed on omeprazole for 2 weeks. Hasnt started this yet. Scheduled for colonoscopy in 03/26/18.  Diverticulosis: Last flareup was ~2 years ago.   MDeovionhas a current medication list which includes the following prescription(s): bupropion, ibuprofen, lisinopril-hydrochlorothiazide, and naproxen sodium. Also is allergic to ciprofloxacin; sulfa antibiotics; and pork-derived products.  MFinnigan has a past medical  history of Alcoholic (HJacobus, Allergy, Anxiety, Depression, Diverticulitis, GERD (gastroesophageal reflux disease), Hyperlipidemia, Hypertension, IBS (irritable bowel syndrome), Trigger index finger of left hand, and Trigger little finger of right hand. Also  has a past surgical history that includes Inguinal hernia repair.   Objective:   Vitals: BP 126/79   Temp 98 F (36.7 C) (Oral)   Resp 16   Ht 6' 0.05" (1.83 m)   Wt 264 lb (119.7 kg)   SpO2 97%   BMI 35.76 kg/m   Physical Exam  Constitutional: He is oriented to person, place, and time. He appears well-developed and well-nourished. No distress.  HENT:  Head: Normocephalic and atraumatic.  Mouth/Throat: Uvula is midline, oropharynx is clear and moist and mucous membranes are normal. No tonsillar exudate.  Eyes: Pupils are equal, round, and reactive to light. Conjunctivae and EOM are normal.  Neck: Normal range of motion.  Cardiovascular: Normal rate, regular rhythm, normal heart sounds and intact distal pulses.  Pulmonary/Chest: Effort normal and breath sounds normal. He has no decreased breath sounds. He has no wheezes. He has no rhonchi. He has no rales.  Musculoskeletal:       Right lower leg: He exhibits no swelling.       Left lower leg: He exhibits no swelling.  Neurological: He is alert and oriented to person, place, and time.  Skin: Skin is warm and dry.  Psychiatric: He has a normal mood and affect.  Vitals reviewed.   No results found for this or any previous visit (from the past 24 hour(s)).  Depression screen [facility] 03/06/2018 03/06/2018 11/22/2017 11/08/2017 12/21/2016  Decreased Interest 0 0 3  0 0  Down, Depressed, Hopeless 1 0 3 0 0  PHQ - 2 Score 1 0 6 0 0  Altered sleeping 1 - 2 - -  Tired, decreased energy 1 - 0 - -  Change in appetite 0 - 0 - -  Feeling bad or failure about yourself  2 - 3 - -  Trouble concentrating 2 - 0 - -  Moving slowly or fidgety/restless 0 - 0 - -  Suicidal thoughts 0 - 0 - -    PHQ-9 Score 7 - 11 - -  Difficult doing work/chores - - Very difficult - -     Assessment and Plan :  1. Essential hypertension Well-controlled at this time.  Labs pending.  Continue with current medication regimen.  Follow-up in 6 months. - CMP14+EGFR - CBC with Differential/Platelet - Urinalysis, dipstick only  2. Obesity (BMI 30-39.9) 3. Dyslipidemia Discontinue use of omega-3 fatty acids due to side effects.  Will obtain lipid panel today.  Will calculate 10-year ASCVD risk score once results return.  If needed, consider initiating statin at that time. - Lipid panel  4. History of prediabetes A1c as of 2 years ago was 6.3, placing him in the prediabetes range.  Will repeat today. - Hemoglobin A1c  5. History of ADHD 6. History of depression 7. Psychosocial stressors Patient cannot afford to go to a psychiatrist at this time.  It appears that Wellbutrin is working well for both ADHD and depression.  PHQ 9 score has decreased from 11 three months ago to 7 today.  Until further evaluation by psychiatry, will continue with current Wellbutrin dose.  Given contact information for local therapist to see if this is more affordable for him to receive counseling from while awaiting becoming employed. - buPROPion (WELLBUTRIN SR) 150 MG 12 hr tablet; Take 1 tablet (150 mg total) by mouth 2 (two) times daily.  Dispense: 180 tablet; Refill: 1  A total of 25 minutes was spent in the room with the patient, greater than 50% of which was in counseling/coordination of care regarding HTN, ADHD, and depression.  Tenna Delaine, PA-C  [facility] 03/06/2018 9:14 AM

## 2018-03-06 NOTE — Patient Instructions (Addendum)
Your blood pressure was great today.  Continue with blood pressure medications as prescribed.  I also believe that Wellbutrin is working well for you.  Continue medication as prescribed.    Below is contact information for local therapists.  We checked labs today and should have those results back within 1 week.  We will discuss if you need to add on any medications to your current regimen.  Follow-up in 3 to 6 months.    For therapy -- [facility] ([address]eth Coralie CommonKincaid, Ernest McCoy, Heather Joycelyn SchmidKitchens, Karla [place]) - [phone] [facility](Julie O[place]) - [phone] [facility] - [phone] [facility] - [phone] [facility] IrishBob Mylan - [phone] [facility]  - [phone] (do not file insurance)    IF you received an x-ray today, you will receive an invoice from [facility]. Please contact [facility] at [phone] with questions or concerns regarding your invoice.   IF you received labwork today, you will receive an invoice from [facility]. Please contact [facility] at [phone] with questions or concerns regarding your invoice.   Our billing staff will not be able to assist you with questions regarding bills from these companies.  You will be contacted with the lab results as soon as they are available. The fastest way to get your results is to activate your My Chart account. Instructions are located on the last page of this paperwork. If you have not heard from us regarding the results in 2 weeks, please contact this office.

## 2018-03-07 LAB — CBC WITH DIFFERENTIAL/PLATELET
BASOS: 1 %
Basophils Absolute: 0.1 10*3/uL (ref 0.0–0.2)
EOS (ABSOLUTE): 0.2 10*3/uL (ref 0.0–0.4)
EOS: 3 %
HEMATOCRIT: 42.7 % (ref 37.5–51.0)
Hemoglobin: 15.1 g/dL (ref 13.0–17.7)
IMMATURE GRANS (ABS): 0 10*3/uL (ref 0.0–0.1)
IMMATURE GRANULOCYTES: 0 %
LYMPHS: 31 %
Lymphocytes Absolute: 1.9 10*3/uL (ref 0.7–3.1)
MCH: 29.5 pg (ref 26.6–33.0)
MCHC: 35.4 g/dL (ref 31.5–35.7)
MCV: 83 fL (ref 79–97)
MONOS ABS: 0.4 10*3/uL (ref 0.1–0.9)
Monocytes: 6 %
Neutrophils Absolute: 3.7 10*3/uL (ref 1.4–7.0)
Neutrophils: 59 %
Platelets: 243 10*3/uL (ref 150–450)
RBC: 5.12 x10E6/uL (ref 4.14–5.80)
RDW: 14.3 % (ref 12.3–15.4)
WBC: 6.3 10*3/uL (ref 3.4–10.8)

## 2018-03-07 LAB — CMP14+EGFR
ALK PHOS: 117 IU/L (ref 39–117)
ALT: 64 IU/L — ABNORMAL HIGH (ref 0–44)
AST: 32 IU/L (ref 0–40)
Albumin/Globulin Ratio: 1.8 (ref 1.2–2.2)
Albumin: 4.3 g/dL (ref 3.5–5.5)
BILIRUBIN TOTAL: 0.3 mg/dL (ref 0.0–1.2)
BUN / CREAT RATIO: 14 (ref 9–20)
BUN: 18 mg/dL (ref 6–24)
CHLORIDE: 100 mmol/L (ref 96–106)
CO2: 23 mmol/L (ref 20–29)
Calcium: 9.7 mg/dL (ref 8.7–10.2)
Creatinine, Ser: 1.31 mg/dL — ABNORMAL HIGH (ref 0.76–1.27)
GFR calc Af Amer: 70 mL/min/{1.73_m2} (ref >59)
GFR calc non Af Amer: 61 mL/min/{1.73_m2} (ref >59)
GLUCOSE: 188 mg/dL — AB (ref 65–99)
Globulin, Total: 2.4 g/dL (ref 1.5–4.5)
Potassium: 4.2 mmol/L (ref 3.5–5.2)
Sodium: 137 mmol/L (ref 134–144)
Total Protein: 6.7 g/dL (ref 6.0–8.5)

## 2018-03-07 LAB — LIPID PANEL
CHOLESTEROL TOTAL: 202 mg/dL — AB (ref 100–199)
Chol/HDL Ratio: 6.5 ratio — ABNORMAL HIGH (ref 0.0–5.0)
HDL: 31 mg/dL — ABNORMAL LOW (ref >39)
LDL Calculated: 111 mg/dL — ABNORMAL HIGH (ref 0–99)
TRIGLYCERIDES: 302 mg/dL — AB (ref 0–149)
VLDL CHOLESTEROL CAL: 60 mg/dL — AB (ref 5–40)

## 2018-03-07 LAB — URINALYSIS, DIPSTICK ONLY
BILIRUBIN UA: NEGATIVE
GLUCOSE, UA: NEGATIVE
Ketones, UA: NEGATIVE
LEUKOCYTES UA: NEGATIVE
Nitrite, UA: NEGATIVE
PH UA: 5.5 (ref 5.0–7.5)
Protein, UA: NEGATIVE
RBC, UA: NEGATIVE
SPEC GRAV UA: 1.019 (ref 1.005–1.030)
Urobilinogen, Ur: 0.2 mg/dL (ref 0.2–1.0)

## 2018-03-07 LAB — HEMOGLOBIN A1C
Est. average glucose Bld gHb Est-mCnc: 200 mg/dL
Hgb A1c MFr Bld: 8.6 % — ABNORMAL HIGH (ref 4.8–5.6)

## 2018-03-08 ENCOUNTER — Other Ambulatory Visit: Payer: Self-pay | Admitting: Physician Assistant

## 2018-03-08 DIAGNOSIS — E119 Type 2 diabetes mellitus without complications: Secondary | ICD-10-CM

## 2018-03-08 MED ORDER — ATORVASTATIN CALCIUM 20 MG PO TABS: 20.0000 mg | ORAL_TABLET | Freq: Every day | ORAL | 3 refills | Status: DC

## 2018-03-08 MED ORDER — METFORMIN HCL 1000 MG PO TABS: 1000.0000 mg | ORAL_TABLET | Freq: Two times a day (BID) | ORAL | 0 refills | Status: DC

## 2018-03-08 NOTE — Progress Notes (Signed)
Meds ordered this encounter  Medications  . metFORMIN (GLUCOPHAGE) 1000 MG tablet    Sig: Take 1 tablet (1,000 mg total) by mouth 2 (two) times daily with a meal.    Dispense:  180 tablet    Refill:  0    Order Specific Question:   Supervising Provider    Answer:   Ethelda ChickSMITH, KRISTI M [[id number]]  . atorvastatin (LIPITOR) 20 MG tablet    Sig: Take 1 tablet (20 mg total) by mouth daily.    Dispense:  90 tablet    Refill:  3    Order Specific Question:   Supervising Provider    Answer:   Ethelda ChickSMITH, KRISTI M [[id number]]

## 2018-03-13 ENCOUNTER — Telehealth: Payer: Self-pay | Admitting: Physician Assistant

## 2018-03-13 NOTE — Telephone Encounter (Signed)
Copied from CRM [id number]. Topic: General - Other >> Mar 13, 2018  3:58 PM Mcneil, Ja-Kwan wrote: Reason for CRM: Pt states he has been receiving phone calls from referrals that he was not aware of. Pt states the calls are in regards to elevated cholesterol and recent diagnosis of diabetes. Pt states he needs to speak with a clinical person to discuss what he should be doing other than taking the medication. Cb# [phone]

## 2018-03-14 NOTE — Telephone Encounter (Signed)
Copied from CRM [phone]. Topic: General - Other >> Mar 14, 2018  9:42 AM Floria RavelingStovall, Shana A wrote: Pt called in and stated that the atorvastatin (LIPITOR) 20 MG tablet [[id number]][[id number]] Is making him feel like "his head is full of cotton".  He stated that balance feel a little off, struggling to complete thoughts.  He does not like the way this med is making him feel.  Please advise?    Best number --  [phone] [facility] [address]

## 2018-03-15 NOTE — Telephone Encounter (Signed)
Pt message re: side effects of Atorvastatin [facility]

## 2018-03-16 NOTE — Telephone Encounter (Signed)
Copied from CRM [id number]. Topic: General - Other >> Mar 16, 2018 11:14 AM Tamela OddiHarris, Brenda J wrote: Reason for CRM: Patient called to state that he has been having bad side effects from his atorvastatin (LIPITOR) 20 MG tablet medication.  He understands that Barnett AbuWiseman is not in the office, but expects a call back from someone else in the office who he feels should be covering for her.  He would like a call back after 1pm today.  CB# [phone].

## 2018-03-17 NOTE — Telephone Encounter (Signed)
Contacted patient via phone.  He has already stopped both medications.  Feels like the metformin was the one causing most of the issues.  He continued to take Lipitor for a few days after stopping metformin and notes he still felt side effects.  Felt dizzy, disoriented, lightheaded.  Feels completely back to normal after stopping the medication.  Has completely changed his diet.  Cut out all sugars, decrease carbohydrates, started walking 30 minutes a day, eating smaller portion sizes..  Educated patient that with these changes, his sugars could be dropping lower and causing him to have these side effects.  Recommend discontinuing metformin completely, continue lifestyle modifications.  Try taking Lipitor 10 mg every other day to see if he has any adverse reactions.  If he has adverse reactions, discontinue medication.  He will follow-up in office next week and we will recheck his fasting blood sugar and determine new plan.

## 2018-03-23 ENCOUNTER — Ambulatory Visit (INDEPENDENT_AMBULATORY_CARE_PROVIDER_SITE_OTHER): Payer: PRIVATE HEALTH INSURANCE | Admitting: Physician Assistant

## 2018-03-23 ENCOUNTER — Other Ambulatory Visit: Payer: Self-pay

## 2018-03-23 ENCOUNTER — Encounter: Payer: Self-pay | Admitting: Physician Assistant

## 2018-03-23 VITALS — BP 122/76 | HR 89 | Temp 99.0°F | Resp 20 | Ht 70.63 in | Wt 254.6 lb

## 2018-03-23 DIAGNOSIS — E785 Hyperlipidemia, unspecified: Secondary | ICD-10-CM

## 2018-03-23 DIAGNOSIS — M25569 Pain in unspecified knee: Secondary | ICD-10-CM | POA: Diagnosis not present

## 2018-03-23 DIAGNOSIS — E119 Type 2 diabetes mellitus without complications: Secondary | ICD-10-CM | POA: Diagnosis not present

## 2018-03-23 DIAGNOSIS — G8929 Other chronic pain: Secondary | ICD-10-CM

## 2018-03-23 DIAGNOSIS — I10 Essential (primary) hypertension: Secondary | ICD-10-CM | POA: Diagnosis not present

## 2018-03-23 DIAGNOSIS — N529 Male erectile dysfunction, unspecified: Secondary | ICD-10-CM

## 2018-03-23 DIAGNOSIS — E669 Obesity, unspecified: Secondary | ICD-10-CM

## 2018-03-23 LAB — GLUCOSE, POCT (MANUAL RESULT ENTRY): POC Glucose: 133 mg/dl — AB (ref 70–99)

## 2018-03-23 MED ORDER — METFORMIN HCL ER 500 MG PO TB24: 500.0000 mg | ORAL_TABLET | Freq: Every day | ORAL | 0 refills | Status: DC

## 2018-03-23 MED ORDER — LISINOPRIL 20 MG PO TABS: 20.0000 mg | ORAL_TABLET | Freq: Every day | ORAL | 0 refills | Status: DC

## 2018-03-23 MED ORDER — BLOOD PRESSURE KIT DEVI: 1.0000 | Freq: Every day | 0 refills | Status: AC

## 2018-03-23 MED ORDER — DICLOFENAC SODIUM 1 % TD GEL: 2.0000 g | Freq: Four times a day (QID) | TRANSDERMAL | 0 refills | Status: DC

## 2018-03-23 NOTE — Patient Instructions (Addendum)
For diabetes, start metformin XR 500mg  daily.    Start checking your blood sugars daily with the glucometer. Check your sugars when you are fasting in the morning before a meal a few times per week and again after you eat your largest meal a few times a week and document these values. Your goal is 70-100. If you drop below 70 over the next two weeks, stop metformin. Signs of hypoglycemia are lightheadedness, blurred vision, headache, and confusion.   Ask insurance about pneumovax and shingrix coverage.   For HTN: stop lisinopril-hctz combo pill and start lisinopril 20mg . It is best if you check the blood pressure at different times in the day. Your goal is <140/90. If your values are consistently above this goal, please return to office for further evaluation.   Please follow up in 3 months.     Diabetes Mellitus and Nutrition When you have diabetes (diabetes mellitus), it is very important to have healthy eating habits because your blood sugar (glucose) levels are greatly affected by what you eat and drink. Eating healthy foods in the appropriate amounts, at about the same times every day, can help you:  Control your blood glucose.  Lower your risk of heart disease.  Improve your blood pressure.  Reach or maintain a healthy weight.  Every person with diabetes is different, and each person has different needs for a meal plan. Your health care provider may recommend that you work with a diet and nutrition specialist (dietitian) to make a meal plan that is best for you. Your meal plan may vary depending on factors such as:  The calories you need.  The medicines you take.  Your weight.  Your blood glucose, blood pressure, and cholesterol levels.  Your activity level.  Other health conditions you have, such as heart or kidney disease.  How do carbohydrates affect me? Carbohydrates affect your blood glucose level more than any other type of food. Eating carbohydrates naturally  increases the amount of glucose in your blood. Carbohydrate counting is a method for keeping track of how many carbohydrates you eat. Counting carbohydrates is important to keep your blood glucose at a healthy level, especially if you use insulin or take certain oral diabetes medicines. It is important to know how many carbohydrates you can safely have in each meal. This is different for every person. Your dietitian can help you calculate how many carbohydrates you should have at each meal and for snack. Foods that contain carbohydrates include:  Bread, cereal, rice, pasta, and crackers.  Potatoes and corn.  Peas, beans, and lentils.  Milk and yogurt.  Fruit and juice.  Desserts, such as cakes, cookies, ice cream, and candy.  How does alcohol affect me? Alcohol can cause a sudden decrease in blood glucose (hypoglycemia), especially if you use insulin or take certain oral diabetes medicines. Hypoglycemia can be a life-threatening condition. Symptoms of hypoglycemia (sleepiness, dizziness, and confusion) are similar to symptoms of having too much alcohol. If your health care provider says that alcohol is safe for you, follow these guidelines:  Limit alcohol intake to no more than 1 drink per day for nonpregnant women and 2 drinks per day for men. One drink equals 12 oz of beer, 5 oz of wine, or 1 oz of hard liquor.  Do not drink on an empty stomach.  Keep yourself hydrated with water, diet soda, or unsweetened iced tea.  Keep in mind that regular soda, juice, and other mixers may contain a lot of sugar  and must be counted as carbohydrates.  What are tips for following this plan? Reading food labels  Start by checking the serving size on the label. The amount of calories, carbohydrates, fats, and other nutrients listed on the label are based on one serving of the food. Many foods contain more than one serving per package.  Check the total grams (g) of carbohydrates in one serving. You  can calculate the number of servings of carbohydrates in one serving by dividing the total carbohydrates by 15. For example, if a food has 30 g of total carbohydrates, it would be equal to 2 servings of carbohydrates.  Check the number of grams (g) of saturated and trans fats in one serving. Choose foods that have low or no amount of these fats.  Check the number of milligrams (mg) of sodium in one serving. Most people should limit total sodium intake to less than 2,300 mg per day.  Always check the nutrition information of foods labeled as "low-fat" or "nonfat". These foods may be higher in added sugar or refined carbohydrates and should be avoided.  Talk to your dietitian to identify your daily goals for nutrients listed on the label. Shopping  Avoid buying canned, premade, or processed foods. These foods tend to be high in fat, sodium, and added sugar.  Shop around the outside edge of the grocery store. This includes fresh fruits and vegetables, bulk grains, fresh meats, and fresh dairy. Cooking  Use low-heat cooking methods, such as baking, instead of high-heat cooking methods like deep frying.  Cook using healthy oils, such as olive, canola, or sunflower oil.  Avoid cooking with butter, cream, or high-fat meats. Meal planning  Eat meals and snacks regularly, preferably at the same times every day. Avoid going long periods of time without eating.  Eat foods high in fiber, such as fresh fruits, vegetables, beans, and whole grains. Talk to your dietitian about how many servings of carbohydrates you can eat at each meal.  Eat 4-6 ounces of lean protein each day, such as lean meat, chicken, fish, eggs, or tofu. 1 ounce is equal to 1 ounce of meat, chicken, or fish, 1 egg, or 1/4 cup of tofu.  Eat some foods each day that contain healthy fats, such as avocado, nuts, seeds, and fish. Lifestyle   Check your blood glucose regularly.  Exercise at least 30 minutes 5 or more days each  week, or as told by your health care provider.  Take medicines as told by your health care provider.  Do not use any products that contain nicotine or tobacco, such as cigarettes and e-cigarettes. If you need help quitting, ask your health care provider.  Work with a Veterinary surgeon or diabetes educator to identify strategies to manage stress and any emotional and social challenges. What are some questions to ask my health care provider?  Do I need to meet with a diabetes educator?  Do I need to meet with a dietitian?  What number can I call if I have questions?  When are the best times to check my blood glucose? Where to find more information:  American Diabetes Association: diabetes.org/food-and-fitness/food  Academy of Nutrition and Dietetics: https://www.vargas.com/  [employer] of Diabetes and Digestive and Kidney Diseases (NIH): FindJewelers.cz Summary  A healthy meal plan will help you control your blood glucose and maintain a healthy lifestyle.  Working with a diet and nutrition specialist (dietitian) can help you make a meal plan that is best for you.  Keep in  mind that carbohydrates and alcohol have immediate effects on your blood glucose levels. It is important to count carbohydrates and to use alcohol carefully. This information is not intended to replace advice given to you by your health care provider. Make sure you discuss any questions you have with your health care provider. Document Released: 05/26/2005 Document Revised: 10/03/2016 Document Reviewed: 10/03/2016 Elsevier Interactive Patient Education  2018 [employer].   How to Take Your Blood Pressure You can take your blood pressure at home with a machine. You may need to check your blood pressure at home:  To check if you have high blood pressure (hypertension).  To check your blood pressure  over time.  To make sure your blood pressure medicine is working.  Supplies needed: You will need a blood pressure machine, or monitor. You can buy one at a drugstore or online. When choosing one:  Choose one with an arm cuff.  Choose one that wraps around your upper arm. Only one finger should fit between your arm and the cuff.  Do not choose one that measures your blood pressure from your wrist or finger.  Your doctor can suggest a monitor. How to prepare Avoid these things for 30 minutes before checking your blood pressure:  Drinking caffeine.  Drinking alcohol.  Eating.  Smoking.  Exercising.  Five minutes before checking your blood pressure:  Pee.  Sit in a dining chair. Avoid sitting in a soft couch or armchair.  Be quiet. Do not talk.  How to take your blood pressure Follow the instructions that came with your machine. If you have a digital blood pressure monitor, these may be the instructions: 1. Sit up straight. 2. Place your feet on the floor. Do not cross your ankles or legs. 3. Rest your left arm at the level of your heart. You may rest it on a table, desk, or chair. 4. Pull up your shirt sleeve. 5. Wrap the blood pressure cuff around the upper part of your left arm. The cuff should be 1 inch (2.5 cm) above your elbow. It is best to wrap the cuff around bare skin. 6. Fit the cuff snugly around your arm. You should be able to place only one finger between the cuff and your arm. 7. Put the cord inside the groove of your elbow. 8. Press the power button. 9. Sit quietly while the cuff fills with air and loses air. 10. Write down the numbers on the screen. 11. Wait 2-3 minutes and then repeat steps 1-10.  What do the numbers mean? Two numbers make up your blood pressure. The first number is called systolic pressure. The second is called diastolic pressure. An example of a blood pressure reading is "120 over 80" (or 120/80). If you are an adult and do not have  a medical condition, use this guide to find out if your blood pressure is normal: Normal  First number: below 120.  Second number: below 80. Elevated  First number: 120-129.  Second number: below 80. Hypertension stage 1  First number: 130-139.  Second number: 80-89. Hypertension stage 2  First number: 140 or above.  Second number: 90 or above. Your blood pressure is above normal even if only the top or bottom number is above normal. Follow these instructions at home:  Check your blood pressure as often as your doctor tells you to.  Take your monitor to your next doctor's appointment. Your doctor will: ? Make sure you are using it correctly. ? Make  sure it is working right.  Make sure you understand what your blood pressure numbers should be.  Tell your doctor if your medicines are causing side effects. Contact a doctor if:  Your blood pressure keeps being high. Get help right away if:  Your first blood pressure number is higher than 180.  Your second blood pressure number is higher than 120. This information is not intended to replace advice given to you by your health care provider. Make sure you discuss any questions you have with your health care provider. Document Released: 08/11/2008 Document Revised: 07/27/2016 Document Reviewed: 02/05/2016 Elsevier Interactive Patient Education  2018 [employer] Inc[employer]     Blood Glucose Monitoring, Adult Monitoring your blood sugar (glucose) helps you manage your diabetes. It also helps you and your health care provider determine how well your diabetes management plan is working. Blood glucose monitoring involves checking your blood glucose as often as directed, and keeping a record (log) of your results over time. Why should I monitor my blood glucose? Checking your blood glucose regularly can:  Help you understand how food, exercise, illnesses, and medicines affect your blood glucose.  Let you know what your blood glucose  is at any time. You can quickly tell if you are having low blood glucose (hypoglycemia) or high blood glucose (hyperglycemia).  Help you and your health care provider adjust your medicines as needed.  When should I check my blood glucose? Follow instructions from your health care provider about how often to check your blood glucose. This may depend on:  The type of diabetes you have.  How well-controlled your diabetes is.  Medicines you are taking.  If you have type 1 diabetes:  Check your blood glucose at least 2 times a day.  Also check your blood glucose: ? Before every insulin injection. ? Before and after exercise. ? Between meals. ? 2 hours after a meal. ? Occasionally between 2:00 a.m. and 3:00 a.m., as directed. ? Before potentially dangerous tasks, like driving or using heavy machinery. ? At bedtime.  You may need to check your blood glucose more often, up to 6-10 times a day: ? If you use an insulin pump. ? If you need multiple daily injections (MDI). ? If your diabetes is not well-controlled. ? If you are ill. ? If you have a history of severe hypoglycemia. ? If you have a history of not knowing when your blood glucose is getting low (hypoglycemia unawareness). If you have type 2 diabetes:  If you take insulin or other diabetes medicines, check your blood glucose at least 2 times a day.  If you are on intensive insulin therapy, check your blood glucose at least 4 times a day. Occasionally, you may also need to check between 2:00 a.m. and 3:00 a.m., as directed.  Also check your blood glucose: ? Before and after exercise. ? Before potentially dangerous tasks, like driving or using heavy machinery.  You may need to check your blood glucose more often if: ? Your medicine is being adjusted. ? Your diabetes is not well-controlled. ? You are ill. What is a blood glucose log?  A blood glucose log is a record of your blood glucose readings. It helps you and your  health care provider: ? Look for patterns in your blood glucose over time. ? Adjust your diabetes management plan as needed.  Every time you check your blood glucose, write down your result and notes about things that may be affecting your blood glucose, such as  your diet and exercise for the day.  Most glucose meters store a record of glucose readings in the meter. Some meters allow you to download your records to a computer. How do I check my blood glucose? Follow these steps to get accurate readings of your blood glucose: Supplies needed   Blood glucose meter.  Test strips for your meter. Each meter has its own strips. You must use the strips that come with your meter.  A needle to prick your finger (lancet). Do not use lancets more than once.  A device that holds the lancet (lancing device).  A journal or log book to write down your results. Procedure  Wash your hands with soap and water.  Prick the side of your finger (not the tip) with the lancet. Use a different finger each time.  Gently rub the finger until a small drop of blood appears.  Follow instructions that come with your meter for inserting the test strip, applying blood to the strip, and using your blood glucose meter.  Write down your result and any notes. Alternative testing sites  Some meters allow you to use areas of your body other than your finger (alternative sites) to test your blood.  If you think you may have hypoglycemia, or if you have hypoglycemia unawareness, do not use alternative sites. Use your finger instead.  Alternative sites may not be as accurate as the fingers, because blood flow is slower in these areas. This means that the result you get may be delayed, and it may be different from the result that you would get from your finger.  The most common alternative sites are: ? Forearm. ? Thigh. ? Palm of the hand. Additional tips  Always keep your supplies with you.  If you have  questions or need help, all blood glucose meters have a 24-hour "hotline" number that you can call. You may also contact your health care provider.  After you use a few boxes of test strips, adjust (calibrate) your blood glucose meter by following instructions that came with your meter. This information is not intended to replace advice given to you by your health care provider. Make sure you discuss any questions you have with your health care provider. Document Released: 09/01/2003 Document Revised: 03/18/2016 Document Reviewed: 02/08/2016 Elsevier Interactive Patient Education  2017 [employer].  IF you received an x-ray today, you will receive an invoice from [facility]. Please contact [facility] at [phone] with questions or concerns regarding your invoice.   IF you received labwork today, you will receive an invoice from [place]. Please contact [facility] at [phone] with questions or concerns regarding your invoice.   Our billing staff will not be able to assist you with questions regarding bills from these companies.  You will be contacted with the lab results as soon as they are available. The fastest way to get your results is to activate your My Chart account. Instructions are located on the last page of this paperwork. If you have not heard from [place] regarding the results in 2 weeks, please contact this office.

## 2018-03-23 NOTE — Progress Notes (Signed)
MRN: [id number]  Subjective:   Kyle Price is a 57 y.o. male who presents for follow up of Type 2 diabetes mellitus.  New diagnosis after labs on 03/06/2018.  A1c was 8.6.  He was started on metformin.  Patient completely changed his diet, eliminated all excess sugar, decreased calories, and started exercising daily.  He was therefore having lots of dizziness, lightheadedness, and fatigue.  I encouraged him to discontinue metformin until follow-up visit so we could check his sugars.  Today, he notes he has cut the Lipitor dose in half and is tolerating medication well.  He is continuing with dietary changes but has increased his calorie count.  Focusing on a low-carb high-protein diet.  He is feeling much better.  He does not have a blood glucometer to check his glucose values at home. Was contacted by diabetic nutritionist  but is going to wait to seem them because he has changed his diet appropriately.  Has never had a diabetic eye exam.  Patient would also like to discuss issues with maintaining and achieving erections.  He has noticed this for the past few years.  Thinks it started around the time he started blood pressure medication.  Denies decreased libido.  No past medical history of heart disease.  Denies smoking.   Patient Active Problem List   Diagnosis Date Noted  . Diverticulitis of colon (without mention of hemorrhage)(562.11) 06/14/2013  . HTN (hypertension) 04/11/2013  . Obesity (BMI 30-39.9) 11/23/2011  . Allergic rhinitis 11/23/2011    Past Medical History:  Diagnosis Date  . 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 Allergy   . Anxiety   . Depression   . Diverticulitis   . GERD (gastroesophageal reflux disease)   . Hyperlipidemia   . Hypertension   . IBS (irritable bowel syndrome)   . Trigger index finger of left hand   . Trigger little finger of right hand     Social History   Socioeconomic History  . Marital status: Married    Spouse name: Not on file  . Number  of children: 1  . Years of education: Not on file  . Highest education level: Not on file  Occupational History  . Occupation: Artist: [employer]  Social Needs  . Financial resource strain: Not on file  . Food insecurity:    Worry: Not on file    Inability: Not on file  . Transportation needs:    Medical: Not on file    Non-medical: Not on file  Tobacco Use  . Smoking status: Former Smoker    Types: Cigarettes    Last attempt to quit: 02/02/2000    Years since quitting: 18.1  . Smokeless tobacco: Never Used  . Tobacco comment: quit 18 years ago  Substance and Sexual Activity  . Alcohol use: No  . Drug use: No  . Sexual activity: Yes    Birth control/protection: Condom    Comment: number of sex partners in the last 12 months 1  Lifestyle  . Physical activity:    Days per week: Not on file    Minutes per session: Not on file  . Stress: Not on file  Relationships  . Social connections:    Talks on phone: Not on file    Gets together: Not on file    Attends religious service: Not on file    Active member of club or organization: Not on file    Attends  meetings of clubs or organizations: Not on file    Relationship status: Not on file  . Intimate partner violence:    Fear of current or ex partner: Not on file    Emotionally abused: Not on file    Physically abused: Not on file    Forced sexual activity: Not on file  Other Topics Concern  . Not on file  Social History Narrative  . Not on file   Review of Systems  Constitutional: Negative for chills and fever.  Respiratory: Negative for shortness of breath.   Cardiovascular: Negative for chest pain and palpitations.  Gastrointestinal: Negative for abdominal pain, nausea and vomiting.  Musculoskeletal: Positive for joint pain (been having more knee pain especially now that he has increased his exercise, takes ibuprofen which helps, wears tennis shoes during exercise, no overlying  redness, warmth, or swelling).  Neurological: Negative for dizziness, tingling, sensory change, speech change and headaches.       Objective:   PHYSICAL EXAM BP 122/76 (BP Location: Left Arm, Patient Position: Sitting, Cuff Size: Large)   Pulse 89   Temp 99 F (37.2 C) (Oral)   Resp 20   Ht 5' 10.63" (1.794 m)   Wt 254 lb 9.6 oz (115.5 kg)   SpO2 96%   BMI 35.88 kg/m   Physical Exam  Constitutional: He is oriented to person, place, and time. He appears well-developed and well-nourished. No distress.  HENT:  Head: Normocephalic and atraumatic.  Mouth/Throat: Uvula is midline, oropharynx is clear and moist and mucous membranes are normal. No tonsillar exudate.  Eyes: Pupils are equal, round, and reactive to light. Conjunctivae, EOM and lids are normal.  Neck: Normal range of motion.  Cardiovascular: Normal rate, regular rhythm, normal heart sounds and intact distal pulses.  Pulmonary/Chest: Effort normal.  Abdominal: Soft. Normal appearance and bowel sounds are normal. There is no tenderness.  Musculoskeletal:       Right knee: Normal. He exhibits no swelling. No tenderness found.       Left knee: Normal. He exhibits no swelling. No tenderness found.       Right lower leg: He exhibits no edema.       Left lower leg: He exhibits no edema.  Neurological: He is alert and oriented to person, place, and time.  Skin: Skin is warm and dry.  Psychiatric: He has a normal mood and affect.  Vitals reviewed.   Diabetic Foot Exam - Simple   Simple Foot Form Visual Inspection No deformities, no ulcerations, no other skin breakdown bilaterally:  Yes Sensation Testing Intact to touch and monofilament testing bilaterally:  Yes Pulse Check Posterior Tibialis and Dorsalis pulse intact bilaterally:  Yes Comments     No results found for this or any previous visit (from the past 24 hour(s)).  Wt Readings from Last 3 Encounters:  03/23/18 254 lb 9.6 oz (115.5 kg)  03/06/18 264 lb  (119.7 kg)  02/01/18 267 lb (121.1 kg)    BP Readings from Last 3 Encounters:  03/23/18 122/76  03/06/18 126/79  02/01/18 132/80    Assessment and Plan :  1. Type 2 diabetes mellitus without complication, without long-term current use of insulin ([place]) New diagnosis.  Discussed mechanism of diabetes.  Discussed potential  complications of uncontrolled diabetes.  Patient is very motivated to make lifestyle modifications that will help gain control over his blood sugars.  He has already made these changes and is committed to them.  Due to drastic change in lifestyle  modifications, recommend low-dose metformin at 500 mg daily.  Recommended patient monitor blood sugar outside the office.  Given glucometer in office.  Labs pending.  Follow-up in 3 months for evaluation. - POCT glucose (manual entry) - CMP14+EGFR - Microalbumin/Creatinine Ratio, Urine - HM Diabetes Foot Exam - metFORMIN (GLUCOPHAGE XR) 500 MG 24 hr tablet; Take 1 tablet (500 mg total) by mouth daily with breakfast.  Dispense: 90 tablet; Refill: 0  2. Essential hypertension Well-controlled on lisinopril HCTZ.  However due to ED,  recommended discontinue HCTZ and increasing lisinopril.  Continue to monitor blood pressure.  Goal is 130/80. - lisinopril (PRINIVIL,ZESTRIL) 20 MG tablet; Take 1 tablet (20 mg total) by mouth daily.  Dispense: 90 tablet; Refill: 0 - Blood Pressure Monitoring (BLOOD PRESSURE KIT) DEVI; 1 Device by Does not apply route daily.  Dispense: 1 Device; Refill: 0  3. Obesity (BMI 30-39.9) Patient has lost 10 pounds since last visit.  He was congratulated on his effort and lifestyle modifications.  Encouraged to continue.  4. Dyslipidemia Continue Lipitor.  5. Erectile dysfunction, unspecified erectile dysfunction type See above. - lisinopril (PRINIVIL,ZESTRIL) 20 MG tablet; Take 1 tablet (20 mg total) by mouth daily.  Dispense: 90 tablet; Refill: 0  6. Chronic knee pain, unspecified laterality For  intermittent knee pain recommend stretching before/after exercise, icing after exercising, and using Voltaren gel as needed.  Advised to return to clinic if symptoms worsen, do not improve, or as needed.  - diclofenac sodium (VOLTAREN) 1 % GEL; Apply 2 g topically 4 (four) times daily.  Dispense: 100 g; Refill: 0   Tenna Delaine, PA-C  [facility] 03/24/2018 1:58 PM

## 2018-03-24 ENCOUNTER — Encounter: Payer: Self-pay | Admitting: Physician Assistant

## 2018-03-24 LAB — CMP14+EGFR
A/G RATIO: 2.1 (ref 1.2–2.2)
ALT: 39 IU/L (ref 0–44)
AST: 28 IU/L (ref 0–40)
Albumin: 4.5 g/dL (ref 3.5–5.5)
Alkaline Phosphatase: 87 IU/L (ref 39–117)
BUN/Creatinine Ratio: 9 (ref 9–20)
BUN: 11 mg/dL (ref 6–24)
Bilirubin Total: 0.5 mg/dL (ref 0.0–1.2)
CALCIUM: 9.6 mg/dL (ref 8.7–10.2)
CO2: 25 mmol/L (ref 20–29)
CREATININE: 1.19 mg/dL (ref 0.76–1.27)
Chloride: 97 mmol/L (ref 96–106)
GFR calc Af Amer: 79 mL/min/{1.73_m2} (ref >59)
GFR, EST NON AFRICAN AMERICAN: 68 mL/min/{1.73_m2} (ref >59)
Globulin, Total: 2.1 g/dL (ref 1.5–4.5)
Glucose: 127 mg/dL — ABNORMAL HIGH (ref 65–99)
POTASSIUM: 3.7 mmol/L (ref 3.5–5.2)
Sodium: 135 mmol/L (ref 134–144)
Total Protein: 6.6 g/dL (ref 6.0–8.5)

## 2018-03-24 LAB — MICROALBUMIN / CREATININE URINE RATIO
Creatinine, Urine: 42.9 mg/dL
Microalb/Creat Ratio: <7 mg/g creat (ref 0.0–30.0)
Microalbumin, Urine: <3 ug/mL

## 2018-03-26 ENCOUNTER — Encounter: Payer: Self-pay | Admitting: Physician Assistant

## 2018-03-26 ENCOUNTER — Encounter: Payer: PRIVATE HEALTH INSURANCE | Admitting: Gastroenterology

## 2018-03-26 ENCOUNTER — Other Ambulatory Visit: Payer: Self-pay

## 2018-03-26 MED ORDER — GLUCOSE BLOOD VI STRP: ORAL_STRIP | 12 refills | Status: DC

## 2018-04-09 ENCOUNTER — Encounter: Payer: Self-pay | Admitting: Physician Assistant

## 2018-04-17 ENCOUNTER — Encounter: Payer: Self-pay | Admitting: Physician Assistant

## 2018-04-17 LAB — HM DIABETES EYE EXAM

## 2018-05-29 ENCOUNTER — Other Ambulatory Visit: Payer: Self-pay | Admitting: Physician Assistant

## 2018-05-30 NOTE — Progress Notes (Signed)
MRN: [id number]  Subjective:   Kyle Price is a 57 y.o. male who presents for follow up of Type 2 diabetes mellitus. Diagnosis was made 02/2017. Patient is currently managed with Continued metformin XR 500mg  daily which has been effective. Admits excellent compliance. Denies adverse effects including metallic taste, hypoglycemia, nausea, vomiting. Patient is checking home blood sugars. Home blood sugar records: BGs range between 70 and 159. Current symptoms include none. Patient denies nausea, paresthesia of the feet, polydipsia, polyuria, visual disturbances and vomiting. Patient is checking their feet daily. No foot concerns. Last diabetic eye exam eye exam 04/17/18. Diet: plant based and chicken. Staying below 40g of carb each day. Exercise includes walking 30 minutes every day. Denies smoking or alcohol use. Known diabetic complications: none  Immunizations: flu vaccine: couple years ago, pneumococal vaccine:never  Other concerns: HTN: Currently managed with lisinopril 20mg . Patient is checking blood pressure at home, range is 110s-120s systolic. Denies lightheadedness, dizziness, chronic headache, double vision, chest pain, shortness of breath, heart racing, palpitations, nausea, vomiting, abdominal pain, hematuria, lower leg swelling.  ED has resolved since stopping HCTZ and making lifestyle changes.   Pt would like to taper off wellbutrin as it is making him more irritable and anxious. Taking one tablet a day.   Patient Active Problem List   Diagnosis Date Noted  . Diverticulitis of colon (without mention of hemorrhage)(562.11) 06/14/2013  . HTN (hypertension) 04/11/2013  . Obesity (BMI 30-39.9) 11/23/2011  . Allergic rhinitis 11/23/2011   Past Medical History:  Diagnosis Date  . Alcoholic ([facility])    binge  . Allergy   . Anxiety   . Depression   . Diverticulitis   . GERD (gastroesophageal reflux disease)   . Hyperlipidemia   . Hypertension   . IBS (irritable bowel  syndrome)   . Trigger index finger of left hand   . Trigger little finger of right hand    Social History   Socioeconomic History  . Marital status: Married    Spouse name: Not on file  . Number of children: 1  . Years of education: Not on file  . Highest education level: Not on file  Occupational History  . Occupation: Catering manager: [employer]  Social Needs  . Financial resource strain: Not on file  . Food insecurity:    Worry: Not on file    Inability: Not on file  . Transportation needs:    Medical: Not on file    Non-medical: Not on file  Tobacco Use  . Smoking status: Former Smoker    Types: Cigarettes    Last attempt to quit: 02/02/2000    Years since quitting: 18.3  . Smokeless tobacco: Never Used  . Tobacco comment: quit 18 years ago  Substance and Sexual Activity  . Alcohol use: No  . Drug use: No  . Sexual activity: Yes    Birth control/protection: Condom    Comment: number of sex partners in the last 12 months 1  Lifestyle  . Physical activity:    Days per week: Not on file    Minutes per session: Not on file  . Stress: Not on file  Relationships  . Social connections:    Talks on phone: Not on file    Gets together: Not on file    Attends religious service: Not on file    Active member of club or organization: Not on file    Attends meetings of clubs or organizations: Not  on file    Relationship status: Not on file  . Intimate partner violence:    Fear of current or ex partner: Not on file    Emotionally abused: Not on file    Physically abused: Not on file    Forced sexual activity: Not on file  Other Topics Concern  . Not on file  Social History Narrative  . Not on file      Objective:   PHYSICAL EXAM BP 118/68   Pulse 75   Temp 99.1 F (37.3 C) (Oral)   Resp 18   Ht 5' 10.63" (1.794 m)   Wt 253 lb 3.2 oz (114.9 kg)   SpO2 97%   BMI 35.69 kg/m   Physical Exam  Constitutional: He is oriented to  person, place, and time. He appears well-developed and well-nourished. No distress.  HENT:  Head: Normocephalic and atraumatic.  Mouth/Throat: Uvula is midline, oropharynx is clear and moist and mucous membranes are normal. No tonsillar exudate.  Eyes: Pupils are equal, round, and reactive to light. Conjunctivae, EOM and lids are normal.  Neck: Normal range of motion.  Cardiovascular: Normal rate, regular rhythm, normal heart sounds and intact distal pulses.  Pulmonary/Chest: Effort normal.  Abdominal: Soft. Normal appearance and bowel sounds are normal. There is no tenderness.  Musculoskeletal:       Right lower leg: He exhibits no edema.       Left lower leg: He exhibits no edema.  Neurological: He is alert and oriented to person, place, and time.  Skin: Skin is warm and dry.  Psychiatric: He has a normal mood and affect.  Vitals reviewed.   Results for orders placed or performed in visit on 05/31/18 (from the past 24 hour(s))  POCT glycosylated hemoglobin (Hb A1C)     Status: Abnormal   Collection Time: 05/31/18  8:18 AM  Result Value Ref Range   Hemoglobin A1C 6.5 (A) 4.0 - 5.6 %   HbA1c POC (<> result, manual entry)     HbA1c, POC (prediabetic range)     HbA1c, POC (controlled diabetic range)       Wt Readings from Last 3 Encounters:  05/31/18 253 lb 3.2 oz (114.9 kg)  03/23/18 254 lb 9.6 oz (115.5 kg)  03/06/18 264 lb (119.7 kg)   BP Readings from Last 3 Encounters:  05/31/18 118/68  03/23/18 122/76  03/06/18 126/79    Assessment and Plan :  1. Type 2 diabetes mellitus without complication, without long-term current use of insulin ([facility]) A1C has sig improved from 8.6 to 6.5 in ~3 months. Pt congratulated on lifestyle changes. Continue metformin XR 500mg  daily. Return in 3 months. If A1C still this controlled, can consider trial off of metformin at that time.  - Basic metabolic panel - POCT glycosylated hemoglobin (Hb A1C) - metFORMIN (GLUCOPHAGE XR) 500 MG 24 hr  tablet; Take 1 tablet (500 mg total) by mouth daily with breakfast.  Dispense: 90 tablet; Refill: 0  2. Essential hypertension Well controlled. Continue med reg. - lisinopril (PRINIVIL,ZESTRIL) 20 MG tablet; Take 1 tablet (20 mg total) by mouth daily.  Dispense: 90 tablet; Refill: 0  3. Obesity (BMI 30-39.9) Continues to lose weight, congratulated on lifestyle changes.   4. Erectile dysfunction, unspecified erectile dysfunction type Resolved. - lisinopril (PRINIVIL,ZESTRIL) 20 MG tablet; Take 1 tablet (20 mg total) by mouth daily.  Dispense: 90 tablet; Refill: 0  5. History of ADHD 6. Anxiety state Rec tap off wellbutrin over next 2 weeks. He  does not want any other meds at this time. Will reevaluate at f/u visit.   Benjiman Core, PA-C  [facility] at [facility] 05/31/2018 8:20 AM

## 2018-05-31 ENCOUNTER — Other Ambulatory Visit: Payer: Self-pay

## 2018-05-31 ENCOUNTER — Encounter: Payer: Self-pay | Admitting: Physician Assistant

## 2018-05-31 ENCOUNTER — Ambulatory Visit (INDEPENDENT_AMBULATORY_CARE_PROVIDER_SITE_OTHER): Payer: PRIVATE HEALTH INSURANCE | Admitting: Physician Assistant

## 2018-05-31 VITALS — BP 118/68 | HR 75 | Temp 99.1°F | Resp 18 | Ht 70.63 in | Wt 253.2 lb

## 2018-05-31 DIAGNOSIS — E119 Type 2 diabetes mellitus without complications: Secondary | ICD-10-CM

## 2018-05-31 DIAGNOSIS — I10 Essential (primary) hypertension: Secondary | ICD-10-CM

## 2018-05-31 DIAGNOSIS — E669 Obesity, unspecified: Secondary | ICD-10-CM | POA: Diagnosis not present

## 2018-05-31 DIAGNOSIS — F411 Generalized anxiety disorder: Secondary | ICD-10-CM

## 2018-05-31 DIAGNOSIS — N529 Male erectile dysfunction, unspecified: Secondary | ICD-10-CM | POA: Diagnosis not present

## 2018-05-31 DIAGNOSIS — Z8659 Personal history of other mental and behavioral disorders: Secondary | ICD-10-CM

## 2018-05-31 LAB — POCT GLYCOSYLATED HEMOGLOBIN (HGB A1C): HEMOGLOBIN A1C: 6.5 % — AB (ref 4.0–5.6)

## 2018-05-31 MED ORDER — METFORMIN HCL ER 500 MG PO TB24: 500.0000 mg | ORAL_TABLET | Freq: Every day | ORAL | 0 refills | Status: DC

## 2018-05-31 MED ORDER — LISINOPRIL 20 MG PO TABS: 20.0000 mg | ORAL_TABLET | Freq: Every day | ORAL | 0 refills | Status: DC

## 2018-05-31 NOTE — Patient Instructions (Addendum)
Continue medications as you are.  You can taper off of wellbutrin. Follow up in 3 months.  So proud of you, keep up the good work!!!!   If you have lab work done today you will be contacted with your lab results within the next 2 weeks.  If you have not heard from [place] then please contact us. The fastest way to get your results is to register for My Chart.  Diabetes Mellitus and Nutrition When you have diabetes (diabetes mellitus), it is very important to have healthy eating habits because your blood sugar (glucose) levels are greatly affected by what you eat and drink. Eating healthy foods in the appropriate amounts, at about the same times every day, can help you:  Control your blood glucose.  Lower your risk of heart disease.  Improve your blood pressure.  Reach or maintain a healthy weight.  Every person with diabetes is different, and each person has different needs for a meal plan. Your health care provider may recommend that you work with a diet and nutrition specialist (dietitian) to make a meal plan that is best for you. Your meal plan may vary depending on factors such as:  The calories you need.  The medicines you take.  Your weight.  Your blood glucose, blood pressure, and cholesterol levels.  Your activity level.  Other health conditions you have, such as heart or kidney disease.  How do carbohydrates affect me? Carbohydrates affect your blood glucose level more than any other type of food. Eating carbohydrates naturally increases the amount of glucose in your blood. Carbohydrate counting is a method for keeping track of how many carbohydrates you eat. Counting carbohydrates is important to keep your blood glucose at a healthy level, especially if you use insulin or take certain oral diabetes medicines. It is important to know how many carbohydrates you can safely have in each meal. This is different for every person. Your dietitian can help you calculate how many  carbohydrates you should have at each meal and for snack. Foods that contain carbohydrates include:  Bread, cereal, rice, pasta, and crackers.  Potatoes and corn.  Peas, beans, and lentils.  Milk and yogurt.  Fruit and juice.  Desserts, such as cakes, cookies, ice cream, and candy.  How does alcohol affect me? Alcohol can cause a sudden decrease in blood glucose (hypoglycemia), especially if you use insulin or take certain oral diabetes medicines. Hypoglycemia can be a life-threatening condition. Symptoms of hypoglycemia (sleepiness, dizziness, and confusion) are similar to symptoms of having too much alcohol. If your health care provider says that alcohol is safe for you, follow these guidelines:  Limit alcohol intake to no more than 1 drink per day for nonpregnant women and 2 drinks per day for men. One drink equals 12 oz of beer, 5 oz of wine, or 1 oz of hard liquor.  Do not drink on an empty stomach.  Keep yourself hydrated with water, diet soda, or unsweetened iced tea.  Keep in mind that regular soda, juice, and other mixers may contain a lot of sugar and must be counted as carbohydrates.  What are tips for following this plan? Reading food labels  Start by checking the serving size on the label. The amount of calories, carbohydrates, fats, and other nutrients listed on the label are based on one serving of the food. Many foods contain more than one serving per package.  Check the total grams (g) of carbohydrates in one serving. You can calculate the  number of servings of carbohydrates in one serving by dividing the total carbohydrates by 15. For example, if a food has 30 g of total carbohydrates, it would be equal to 2 servings of carbohydrates.  Check the number of grams (g) of saturated and trans fats in one serving. Choose foods that have low or no amount of these fats.  Check the number of milligrams (mg) of sodium in one serving. Most people should limit total sodium  intake to less than 2,300 mg per day.  Always check the nutrition information of foods labeled as "low-fat" or "nonfat". These foods may be higher in added sugar or refined carbohydrates and should be avoided.  Talk to your dietitian to identify your daily goals for nutrients listed on the label. Shopping  Avoid buying canned, premade, or processed foods. These foods tend to be high in fat, sodium, and added sugar.  Shop around the outside edge of the grocery store. This includes fresh fruits and vegetables, bulk grains, fresh meats, and fresh dairy. Cooking  Use low-heat cooking methods, such as baking, instead of high-heat cooking methods like deep frying.  Cook using healthy oils, such as olive, canola, or sunflower oil.  Avoid cooking with butter, cream, or high-fat meats. Meal planning  Eat meals and snacks regularly, preferably at the same times every day. Avoid going long periods of time without eating.  Eat foods high in fiber, such as fresh fruits, vegetables, beans, and whole grains. Talk to your dietitian about how many servings of carbohydrates you can eat at each meal.  Eat 4-6 ounces of lean protein each day, such as lean meat, chicken, fish, eggs, or tofu. 1 ounce is equal to 1 ounce of meat, chicken, or fish, 1 egg, or 1/4 cup of tofu.  Eat some foods each day that contain healthy fats, such as avocado, nuts, seeds, and fish. Lifestyle   Check your blood glucose regularly.  Exercise at least 30 minutes 5 or more days each week, or as told by your health care provider.  Take medicines as told by your health care provider.  Do not use any products that contain nicotine or tobacco, such as cigarettes and e-cigarettes. If you need help quitting, ask your health care provider.  Work with a Veterinary surgeoncounselor or diabetes educator to identify strategies to manage stress and any emotional and social challenges. What are some questions to ask my health care provider?  Do I need  to meet with a diabetes educator?  Do I need to meet with a dietitian?  What number can I call if I have questions?  When are the best times to check my blood glucose? Where to find more information:  American Diabetes Association: diabetes.org/food-and-fitness/food  Academy of Nutrition and Dietetics: https://www.vargas.com/www.eatright.org/resources/health/diseases-and-conditions/diabetes  [employer] Institute of Diabetes and Digestive and Kidney Diseases (NIH): FindJewelers.czwww.niddk.nih.gov/health-information/diabetes/overview/diet-eating-physical-activity Summary  A healthy meal plan will help you control your blood glucose and maintain a healthy lifestyle.  Working with a diet and nutrition specialist (dietitian) can help you make a meal plan that is best for you.  Keep in mind that carbohydrates and alcohol have immediate effects on your blood glucose levels. It is important to count carbohydrates and to use alcohol carefully. This information is not intended to replace advice given to you by your health care provider. Make sure you discuss any questions you have with your health care provider. Document Released: 05/26/2005 Document Revised: 10/03/2016 Document Reviewed: 10/03/2016 Elsevier Interactive Patient Education  [employer]2018 Elsevier Inc.  IF you received an x-ray today, you will receive an invoice from [facility]. Please contact [facility] at [phone] with questions or concerns regarding your invoice.   IF you received labwork today, you will receive an invoice from [facility]. Please contact [facility] at [phone] with questions or concerns regarding your invoice.   Our billing staff will not be able to assist you with questions regarding bills from these companies.  You will be contacted with the lab results as soon as they are available. The fastest way to get your results is to activate your My Chart account. Instructions are located on the last page of this paperwork. If you have  not heard from [place] regarding the results in 2 weeks, please contact this office.

## 2018-06-01 ENCOUNTER — Encounter: Payer: PRIVATE HEALTH INSURANCE | Admitting: Gastroenterology

## 2018-06-01 LAB — BASIC METABOLIC PANEL
BUN/Creatinine Ratio: 13 (ref 9–20)
BUN: 14 mg/dL (ref 6–24)
CALCIUM: 9 mg/dL (ref 8.7–10.2)
CO2: 21 mmol/L (ref 20–29)
CREATININE: 1.07 mg/dL (ref 0.76–1.27)
Chloride: 103 mmol/L (ref 96–106)
GFR calc Af Amer: 90 mL/min/{1.73_m2} (ref >59)
GFR, EST NON AFRICAN AMERICAN: 78 mL/min/{1.73_m2} (ref >59)
Glucose: 129 mg/dL — ABNORMAL HIGH (ref 65–99)
Potassium: 4.3 mmol/L (ref 3.5–5.2)
Sodium: 141 mmol/L (ref 134–144)

## 2018-06-12 ENCOUNTER — Telehealth: Payer: PRIVATE HEALTH INSURANCE | Admitting: Physician Assistant

## 2018-06-12 DIAGNOSIS — B9689 Other specified bacterial agents as the cause of diseases classified elsewhere: Secondary | ICD-10-CM

## 2018-06-12 DIAGNOSIS — J019 Acute sinusitis, unspecified: Secondary | ICD-10-CM

## 2018-06-12 MED ORDER — DOXYCYCLINE HYCLATE 100 MG PO CAPS: 100.0000 mg | ORAL_CAPSULE | Freq: Two times a day (BID) | ORAL | 0 refills | Status: AC

## 2018-06-12 NOTE — Progress Notes (Signed)

## 2018-06-21 ENCOUNTER — Ambulatory Visit: Payer: PRIVATE HEALTH INSURANCE | Admitting: Physician Assistant

## 2018-06-27 ENCOUNTER — Encounter: Payer: Self-pay | Admitting: Physician Assistant

## 2018-07-10 ENCOUNTER — Ambulatory Visit (INDEPENDENT_AMBULATORY_CARE_PROVIDER_SITE_OTHER): Payer: No Typology Code available for payment source | Admitting: Family Medicine

## 2018-07-10 DIAGNOSIS — Z23 Encounter for immunization: Secondary | ICD-10-CM

## 2018-07-25 ENCOUNTER — Encounter: Payer: Self-pay | Admitting: Physician Assistant

## 2018-07-26 ENCOUNTER — Telehealth: Payer: Self-pay | Admitting: Physician Assistant

## 2018-07-26 NOTE — Telephone Encounter (Signed)
Called and spoke with patient regarding their appt on 09/03/18 with Barnett AbuWiseman. Advised that we have 3 providers that are accepting new patients. I was able to make an appt with Dr. Creta LevinStallings on 09/04/18 at 8:20. I advised pt of time, building number and late policy. Pt acknowledged.

## 2018-09-03 ENCOUNTER — Ambulatory Visit: Payer: PRIVATE HEALTH INSURANCE | Admitting: Physician Assistant

## 2018-09-04 ENCOUNTER — Ambulatory Visit (INDEPENDENT_AMBULATORY_CARE_PROVIDER_SITE_OTHER): Payer: No Typology Code available for payment source | Admitting: Family Medicine

## 2018-09-04 ENCOUNTER — Encounter: Payer: Self-pay | Admitting: Family Medicine

## 2018-09-04 VITALS — BP 123/75 | HR 64 | Temp 98.4°F | Resp 16 | Ht 70.5 in | Wt 254.0 lb

## 2018-09-04 DIAGNOSIS — E785 Hyperlipidemia, unspecified: Secondary | ICD-10-CM | POA: Diagnosis not present

## 2018-09-04 DIAGNOSIS — Z1211 Encounter for screening for malignant neoplasm of colon: Secondary | ICD-10-CM

## 2018-09-04 DIAGNOSIS — Z1159 Encounter for screening for other viral diseases: Secondary | ICD-10-CM

## 2018-09-04 DIAGNOSIS — I10 Essential (primary) hypertension: Secondary | ICD-10-CM

## 2018-09-04 DIAGNOSIS — E119 Type 2 diabetes mellitus without complications: Secondary | ICD-10-CM | POA: Diagnosis not present

## 2018-09-04 DIAGNOSIS — N529 Male erectile dysfunction, unspecified: Secondary | ICD-10-CM

## 2018-09-04 DIAGNOSIS — R739 Hyperglycemia, unspecified: Secondary | ICD-10-CM | POA: Diagnosis not present

## 2018-09-04 LAB — POCT URINALYSIS DIP (MANUAL ENTRY)
BILIRUBIN UA: NEGATIVE
GLUCOSE UA: NEGATIVE mg/dL
Ketones, POC UA: NEGATIVE mg/dL
Leukocytes, UA: NEGATIVE
Nitrite, UA: NEGATIVE
Protein Ur, POC: NEGATIVE mg/dL
SPEC GRAV UA: 1.02 (ref 1.010–1.025)
Urobilinogen, UA: 0.2 E.U./dL
pH, UA: 5.5 (ref 5.0–8.0)

## 2018-09-04 LAB — POCT GLYCOSYLATED HEMOGLOBIN (HGB A1C): HEMOGLOBIN A1C: 6.3 % — AB (ref 4.0–5.6)

## 2018-09-04 LAB — GLUCOSE, POCT (MANUAL RESULT ENTRY): POC Glucose: 126 mg/dl — AB (ref 70–99)

## 2018-09-04 MED ORDER — GLUCOSE BLOOD VI STRP: ORAL_STRIP | 12 refills | Status: DC

## 2018-09-04 MED ORDER — METFORMIN HCL ER 500 MG PO TB24: 500.0000 mg | ORAL_TABLET | Freq: Every day | ORAL | 1 refills | Status: DC

## 2018-09-04 MED ORDER — LISINOPRIL 20 MG PO TABS: 20.0000 mg | ORAL_TABLET | Freq: Every day | ORAL | 1 refills | Status: DC

## 2018-09-04 NOTE — Patient Instructions (Signed)
° ° ° °  If you have lab work done today you will be contacted with your lab results within the next 2 weeks.  If you have not heard from us then please contact us. The fastest way to get your results is to register for My Chart. ° ° °IF you received an x-ray today, you will receive an invoice from [facility]. Please contact [facility] at [phone] with questions or concerns regarding your invoice.  ° °IF you received labwork today, you will receive an invoice from [facility]. Please contact [facility] at [phone] with questions or concerns regarding your invoice.  ° °Our billing staff will not be able to assist you with questions regarding bills from these companies. ° °You will be contacted with the lab results as soon as they are available. The fastest way to get your results is to activate your My Chart account. Instructions are located on the last page of this paperwork. If you have not heard from us regarding the results in 2 weeks, please contact this office. °  ° ° ° °

## 2018-09-04 NOTE — Progress Notes (Signed)
Established Patient Office Visit  Subjective:  Patient ID: Kyle Price, male    DOB: [birth year]  Age: 57 y.o. MRN: [id number]  CC:  Chief Complaint  Patient presents with  . New pt    estab care former [place], [place]  . Diabetes    f/u 3 mos    HPI [name] presents for   Diabetes Mellitus: Patient presents for follow up of diabetes. Symptoms: none. Symptoms have stabilized. Patient denies nausea, polydipsia, polyuria, visual disturbances, vomitting and weight loss.  Evaluation to date has been included: hemoglobin A1C.  Home sugars: patient does not check sugars. Treatment to date: none.  Lab Results  Component Value Date   HGBA1C 6.3 (A) 09/04/2018   Hypertension: Patient here for follow-up of elevated blood pressure. He is exercising and is adherent to low salt diet.  Blood pressure is well controlled at home. Cardiac symptoms none. Patient denies chest pain, chest pressure/discomfort, claudication, dyspnea and exertional chest pressure/discomfort.   BP Readings from Last 3 Encounters:  09/04/18 123/75  05/31/18 118/68  03/23/18 122/76   Dyslipidemia: Patient presents for evaluation of lipids.  Compliance with treatment thus far has been excellent.  A repeat fasting lipid profile was not done.  The patient does not use medications that may worsen dyslipidemias (corticosteroids, progestins, anabolic steroids, diuretics, beta-blockers, amiodarone, cyclosporine, olanzapine). The patient exercises daily.  The patient is not known to have coexisting coronary artery disease.   The 10-year ASCVD risk score [name] DC Jr., et al., 2013) is: 18.5%   Values used to calculate the score:     Age: 13 years     Sex: Male     Is Non-Hispanic African American: No     Diabetic: Yes     Tobacco smoker: No     Systolic Blood Pressure: 209 mmHg     Is BP treated: Yes     HDL Cholesterol: 31 mg/dL     Total Cholesterol: 202 mg/dL   Colon Cancer Screening He has never had a  colonoscopy He denies blood in his stool, unexpected weight loss or pain with defecation No rectal itching He does not smoke He does have a family history of colon cancer in maternal and paternal grandfathers.     Past Medical History:  Diagnosis Date  . 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 Allergy   . Anxiety   . Depression   . Diverticulitis   . GERD (gastroesophageal reflux disease)   . Hyperlipidemia   . Hypertension   . IBS (irritable bowel syndrome)   . Trigger index finger of left hand   . Trigger little finger of right hand     Past Surgical History:  Procedure Laterality Date  . INGUINAL HERNIA REPAIR     age 55    Family History  Problem Relation Age of Onset  . Heart disease Father   . Multiple myeloma Father   . Heart attack Father   . Heart disease Brother        MI  . Diabetes Brother   . Other Brother        precancerous esophageal cells  . Breast cancer Mother        mets to lung and bone  . Heart attack Mother   . Heart disease Maternal Grandmother   . Heart disease Maternal Grandfather   . Prostate cancer Maternal Grandfather   . Stomach cancer Paternal Grandfather 15  . Skin cancer Sister   . Colon  cancer Neg Hx   . Colon polyps Neg Hx   . Esophageal cancer Neg Hx   . Rectal cancer Neg Hx     Social History   Socioeconomic History  . Marital status: Married    Spouse name: Not on file  . Number of children: 1  . Years of education: Not on file  . Highest education level: Not on file  Occupational History  . Occupation: Artist: [employer]  Social Needs  . Financial resource strain: Not on file  . Food insecurity:    Worry: Not on file    Inability: Not on file  . Transportation needs:    Medical: Not on file    Non-medical: Not on file  Tobacco Use  . Smoking status: Former Smoker    Types: Cigarettes    Last attempt to quit: 02/02/2000    Years since quitting: 18.6  . Smokeless tobacco: Never  Used  . Tobacco comment: quit 18 years ago  Substance and Sexual Activity  . Alcohol use: No  . Drug use: No  . Sexual activity: Yes    Birth control/protection: Condom    Comment: number of sex partners in the last 12 months 1  Lifestyle  . Physical activity:    Days per week: Not on file    Minutes per session: Not on file  . Stress: Not on file  Relationships  . Social connections:    Talks on phone: Not on file    Gets together: Not on file    Attends religious service: Not on file    Active member of club or organization: Not on file    Attends meetings of clubs or organizations: Not on file    Relationship status: Not on file  . Intimate partner violence:    Fear of current or ex partner: Not on file    Emotionally abused: Not on file    Physically abused: Not on file    Forced sexual activity: Not on file  Other Topics Concern  . Not on file  Social History Narrative  . Not on file    Outpatient Medications Prior to Visit  Medication Sig Dispense Refill  . atorvastatin (LIPITOR) 20 MG tablet Take 1 tablet (20 mg total) by mouth daily. 90 tablet 3  . Blood Pressure Monitoring (BLOOD PRESSURE KIT) DEVI 1 Device by Does not apply route daily. 1 Device 0  . diclofenac sodium (VOLTAREN) 1 % GEL Apply 2 g topically 4 (four) times daily. 100 g 0  . glucose blood (CONTOUR NEXT TEST) test strip Use as instructed. Dx code E11.9 100 each 12  . ibuprofen (ADVIL,MOTRIN) 200 MG tablet Take 400 mg by mouth as needed.     Marland Kitchen lisinopril (PRINIVIL,ZESTRIL) 20 MG tablet Take 1 tablet (20 mg total) by mouth daily. 90 tablet 0  . metFORMIN (GLUCOPHAGE XR) 500 MG 24 hr tablet Take 1 tablet (500 mg total) by mouth daily with breakfast. 90 tablet 0  . naproxen sodium (ALEVE) 220 MG tablet Take 220 mg by mouth as needed.    Marland Kitchen buPROPion (WELLBUTRIN SR) 150 MG 12 hr tablet Take 1 tablet (150 mg total) by mouth 2 (two) times daily. (Patient not taking: Reported on 09/04/2018) 180 tablet 1   No  facility-administered medications prior to visit.     Allergies  Allergen Reactions  . Ciprofloxacin     dizziness  . Sulfa Antibiotics     rash  .  Pork-Derived Products Rash    Factory raised     ROS Review of Systems  Review of Systems  Constitutional: Negative for activity change, appetite change, chills and fever.  HENT: Negative for congestion, nosebleeds, trouble swallowing and voice change.   Respiratory: Negative for cough, shortness of breath and wheezing.   Gastrointestinal: Negative for diarrhea, nausea and vomiting.  Genitourinary: Negative for difficulty urinating, dysuria, flank pain and hematuria.  Musculoskeletal: Negative for back pain, joint swelling and neck pain.  Neurological: Negative for dizziness, speech difficulty, light-headedness and numbness.  See HPI. All other review of systems negative.     Objective:    Physical Exam  BP 123/75 (BP Location: Left Arm, Patient Position: Sitting, Cuff Size: Large)   Pulse 64   Temp 98.4 F (36.9 C) (Oral)   Resp 16   Ht 5' 10.5" (1.791 m)   Wt 254 lb (115.2 kg)   SpO2 95%   BMI 35.93 kg/m  Wt Readings from Last 3 Encounters:  09/04/18 254 lb (115.2 kg)  05/31/18 253 lb 3.2 oz (114.9 kg)  03/23/18 254 lb 9.6 oz (115.5 kg)   Physical Exam  Constitutional: Oriented to person, place, and time. Appears well-developed and well-nourished.  HENT:  Head: Normocephalic and atraumatic.  Eyes: Conjunctivae and EOM are normal.  Cardiovascular: Normal rate, regular rhythm, normal heart sounds and intact distal pulses.  No murmur heard. Pulmonary/Chest: Effort normal and breath sounds normal. No stridor. No respiratory distress. Has no wheezes.  Neurological: Is alert and oriented to person, place, and time.  Skin: Skin is warm. Capillary refill takes less than 2 seconds.  Psychiatric: Has a normal mood and affect. Behavior is normal. Judgment and thought content normal.    Health Maintenance Due  Topic Date  Due  . Hepatitis C Screening  20-Oct-1960  . HIV Screening  07/31/1977  . COLONOSCOPY  07/31/2012    There are no preventive care reminders to display for this patient.  Lab Results  Component Value Date   TSH 1.300 11/08/2017   Lab Results  Component Value Date   WBC 6.3 03/06/2018   HGB 15.1 03/06/2018   HCT 42.7 03/06/2018   MCV 83 03/06/2018   PLT 243 03/06/2018   Lab Results  Component Value Date   NA 141 05/31/2018   K 4.3 05/31/2018   CO2 21 05/31/2018   GLUCOSE 129 (H) 05/31/2018   BUN 14 05/31/2018   CREATININE 1.07 05/31/2018   BILITOT 0.5 03/23/2018   ALKPHOS 87 03/23/2018   AST 28 03/23/2018   ALT 39 03/23/2018   PROT 6.6 03/23/2018   ALBUMIN 4.5 03/23/2018   CALCIUM 9.0 05/31/2018   Lab Results  Component Value Date   CHOL 202 (H) 03/06/2018   Lab Results  Component Value Date   HDL 31 (L) 03/06/2018   Lab Results  Component Value Date   LDLCALC 111 (H) 03/06/2018   Lab Results  Component Value Date   TRIG 302 (H) 03/06/2018   Lab Results  Component Value Date   CHOLHDL 6.5 (H) 03/06/2018   Lab Results  Component Value Date   HGBA1C 6.3 (A) 09/04/2018      Assessment & Plan:   Problem List Items Addressed This Visit      Cardiovascular and Mediastinum   HTN (hypertension) - Patient's blood pressure is at goal of 139/89 or less. Condition is stable. Continue current medications and treatment plan. I recommend that you exercise for 30-45 minutes 5 days a  week. I also recommend a balanced diet with fruits and vegetables every day, lean meats, and little fried foods. The DASH diet (you can find this online) is a good example of this.    Relevant Orders   Comprehensive metabolic panel    Other Visit Diagnoses    Elevated blood sugar    -  Primary    Relevant Orders   POCT glycosylated hemoglobin (Hb A1C) (Completed)   POCT glucose (manual entry) (Completed)   POCT urinalysis dipstick (Completed)    Type 2 diabetes mellitus  without complication, without long-term current use of insulin (H[facility])    well controlled hemoglobin a1c is at goal Continue exercise Lipids monitored and renal function in range On metformin On ace On asa 64m Reviewed diabetic foot care Emphasized importance of eye and dental exam       Relevant Orders   Comprehensive metabolic panel    Dyslipidemia     Discussed medications that affect lipids Reminded patient to avoid grapefruits Reviewed last 3 lipids Discussed current meds: statin, aspirin Advised dietary fiber and fish oil and ways to keep HDL high CAD prevention and reviewed side effects of statins    Relevant Orders   Comprehensive metabolic panel   Encounter for hepatitis C screening test for low risk patient       Relevant Orders   HCV Ab w/Rflx to Verification   Special screening for malignant neoplasms, colon       Relevant Orders   Cologuard      No orders of the defined types were placed in this encounter.   Follow-up: No follow-ups on file.    ZForrest Moron MD

## 2018-09-05 LAB — COMPREHENSIVE METABOLIC PANEL
ALBUMIN: 4.6 g/dL (ref 3.5–5.5)
ALK PHOS: 98 IU/L (ref 39–117)
ALT: 50 IU/L — ABNORMAL HIGH (ref 0–44)
AST: 23 IU/L (ref 0–40)
Albumin/Globulin Ratio: 2 (ref 1.2–2.2)
BILIRUBIN TOTAL: 0.6 mg/dL (ref 0.0–1.2)
BUN / CREAT RATIO: 11 (ref 9–20)
BUN: 13 mg/dL (ref 6–24)
CHLORIDE: 99 mmol/L (ref 96–106)
CO2: 22 mmol/L (ref 20–29)
CREATININE: 1.2 mg/dL (ref 0.76–1.27)
Calcium: 9.8 mg/dL (ref 8.7–10.2)
GFR calc non Af Amer: 67 mL/min/{1.73_m2} (ref >59)
GFR, EST AFRICAN AMERICAN: 78 mL/min/{1.73_m2} (ref >59)
GLOBULIN, TOTAL: 2.3 g/dL (ref 1.5–4.5)
Glucose: 121 mg/dL — ABNORMAL HIGH (ref 65–99)
Potassium: 4.5 mmol/L (ref 3.5–5.2)
SODIUM: 136 mmol/L (ref 134–144)
TOTAL PROTEIN: 6.9 g/dL (ref 6.0–8.5)

## 2018-09-05 LAB — HCV AB W/RFLX TO VERIFICATION

## 2018-09-05 LAB — HCV INTERPRETATION

## 2018-10-04 ENCOUNTER — Telehealth: Payer: Self-pay | Admitting: Physician Assistant

## 2018-10-04 DIAGNOSIS — B9689 Other specified bacterial agents as the cause of diseases classified elsewhere: Secondary | ICD-10-CM

## 2018-10-04 DIAGNOSIS — J019 Acute sinusitis, unspecified: Secondary | ICD-10-CM

## 2018-10-04 MED ORDER — AMOXICILLIN-POT CLAVULANATE 875-125 MG PO TABS: 1.0000 | ORAL_TABLET | Freq: Two times a day (BID) | ORAL | 0 refills | Status: DC

## 2018-10-04 NOTE — Progress Notes (Signed)

## 2018-10-31 ENCOUNTER — Telehealth: Payer: Self-pay | Admitting: Family Medicine

## 2018-10-31 NOTE — Telephone Encounter (Signed)
MyChart message sent to pt about rescheduling their appointment with Dr Creta Levin on 03/06/19 at 9am due to provider being out of the office that day

## 2019-02-25 ENCOUNTER — Other Ambulatory Visit: Payer: Self-pay | Admitting: Family Medicine

## 2019-02-25 DIAGNOSIS — E119 Type 2 diabetes mellitus without complications: Secondary | ICD-10-CM

## 2019-02-25 DIAGNOSIS — N529 Male erectile dysfunction, unspecified: Secondary | ICD-10-CM

## 2019-02-25 DIAGNOSIS — I10 Essential (primary) hypertension: Secondary | ICD-10-CM

## 2019-02-27 ENCOUNTER — Other Ambulatory Visit: Payer: Self-pay | Admitting: Physician Assistant

## 2019-02-27 NOTE — Telephone Encounter (Signed)
Would Dr. Stallings like to take over this rx 

## 2019-03-06 ENCOUNTER — Ambulatory Visit: Payer: PRIVATE HEALTH INSURANCE | Admitting: Family Medicine

## 2019-03-14 ENCOUNTER — Ambulatory Visit: Payer: No Typology Code available for payment source | Admitting: Family Medicine

## 2019-03-18 ENCOUNTER — Ambulatory Visit (INDEPENDENT_AMBULATORY_CARE_PROVIDER_SITE_OTHER): Payer: No Typology Code available for payment source | Admitting: Family Medicine

## 2019-03-18 ENCOUNTER — Encounter: Payer: Self-pay | Admitting: Family Medicine

## 2019-03-18 ENCOUNTER — Other Ambulatory Visit: Payer: Self-pay

## 2019-03-18 VITALS — BP 140/82 | HR 68 | Temp 98.6°F | Resp 18 | Ht 70.5 in | Wt 263.0 lb

## 2019-03-18 DIAGNOSIS — Z1211 Encounter for screening for malignant neoplasm of colon: Secondary | ICD-10-CM

## 2019-03-18 DIAGNOSIS — E119 Type 2 diabetes mellitus without complications: Secondary | ICD-10-CM

## 2019-03-18 DIAGNOSIS — E669 Obesity, unspecified: Secondary | ICD-10-CM

## 2019-03-18 DIAGNOSIS — E785 Hyperlipidemia, unspecified: Secondary | ICD-10-CM | POA: Diagnosis not present

## 2019-03-18 DIAGNOSIS — I10 Essential (primary) hypertension: Secondary | ICD-10-CM | POA: Diagnosis not present

## 2019-03-18 MED ORDER — CONTOUR NEXT TEST VI STRP: ORAL_STRIP | 12 refills | Status: AC

## 2019-03-18 NOTE — Progress Notes (Signed)
Established Patient Office Visit  Subjective:  Patient ID: Kyle Price, male    DOB: 03/24/1961  Age: 58 y.o. MRN: [id number]  CC:  Chief Complaint  Patient presents with  . Diabetes    6 month check on his diabetes  . Medication Refill    needs refill on test strips    HPI Trice Aspinall presents for   Diabetes Pt reports that his blood sugars have dropped below 80 a few times He states that this is about 2-3 times a month He is dieting and exercise He feels pressure in his temples and feels confused when this happens and typically has blood glucose of 70s to 80s when this happens Lab Results  Component Value Date   HGBA1C 6.3 (A) 09/04/2018   Obesity He states that he has been stress eating He reports that this crisis has lead to him eating more and gaining weight despite exercise He states that he eats rice crackers and popcorn He denies alcohol He has some dark chocolate every once in a while Body mass index is 37.2 kg/m.  Wt Readings from Last 3 Encounters:  03/18/19 263 lb (119.3 kg)  09/04/18 254 lb (115.2 kg)  05/31/18 253 lb 3.2 oz (114.9 kg)   He did not take his metformin  Essential Hypertension: Patient here for follow-up of elevated blood pressure. He is exercising and is adherent to low salt diet.  Blood pressure is well controlled at home. Cardiac symptoms none. Patient denies chest pain, chest pressure/discomfort, claudication, dyspnea, exertional chest pressure/discomfort, fatigue and irregular heart beat.  Cardiovascular risk factors: diabetes mellitus and hypertension. Use of agents associated with hypertension: none. History of target organ damage: none.  He states that he was running late as well which is my his initial bp was elevated.  BP Readings from Last 3 Encounters:  03/18/19 140/82  09/04/18 123/75  05/31/18 118/68      Past Medical History:  Diagnosis Date  . Alcoholic ([place])    binge  . Allergy   . Anxiety   .  Depression   . Diverticulitis   . GERD (gastroesophageal reflux disease)   . Hyperlipidemia   . Hypertension   . IBS (irritable bowel syndrome)   . Trigger index finger of left hand   . Trigger little finger of right hand     Past Surgical History:  Procedure Laterality Date  . INGUINAL HERNIA REPAIR     age 7    Family History  Problem Relation Age of Onset  . Heart disease Father   . Multiple myeloma Father   . Heart attack Father   . Heart disease Brother        MI  . Diabetes Brother   . Other Brother        precancerous esophageal cells  . Breast cancer Mother        mets to lung and bone  . Heart attack Mother   . Heart disease Maternal Grandmother   . Heart disease Maternal Grandfather   . Prostate cancer Maternal Grandfather   . Stomach cancer Paternal Grandfather 56  . Skin cancer Sister   . Colon cancer Neg Hx   . Colon polyps Neg Hx   . Esophageal cancer Neg Hx   . Rectal cancer Neg Hx     Social History   Socioeconomic History  . Marital status: Married    Spouse name: Not on file  . Number of children: 1  . Years of  education: Not on file  . Highest education level: Not on file  Occupational History  . Occupation: Artist: [employer]  Social Needs  . Financial resource strain: Not on file  . Food insecurity    Worry: Not on file    Inability: Not on file  . Transportation needs    Medical: Not on file    Non-medical: Not on file  Tobacco Use  . Smoking status: Former Smoker    Types: Cigarettes    Quit date: 02/02/2000    Years since quitting: 19.1  . Smokeless tobacco: Never Used  . Tobacco comment: quit 18 years ago  Substance and Sexual Activity  . Alcohol use: No  . Drug use: No  . Sexual activity: Yes    Birth control/protection: Condom    Comment: number of sex partners in the last 12 months 1  Lifestyle  . Physical activity    Days per week: Not on file    Minutes per session: Not on  file  . Stress: Not on file  Relationships  . Social Herbalist on phone: Not on file    Gets together: Not on file    Attends religious service: Not on file    Active member of club or organization: Not on file    Attends meetings of clubs or organizations: Not on file    Relationship status: Not on file  . Intimate partner violence    Fear of current or ex partner: Not on file    Emotionally abused: Not on file    Physically abused: Not on file    Forced sexual activity: Not on file  Other Topics Concern  . Not on file  Social History Narrative  . Not on file    Outpatient Medications Prior to Visit  Medication Sig Dispense Refill  . atorvastatin (LIPITOR) 20 MG tablet TAKE 1 TABLET BY MOUTH EVERY DAY 30 tablet 0  . Blood Pressure Monitoring (BLOOD PRESSURE KIT) DEVI 1 Device by Does not apply route daily. 1 Device 0  . diclofenac sodium (VOLTAREN) 1 % GEL Apply 2 g topically 4 (four) times daily. 100 g 0  . ibuprofen (ADVIL,MOTRIN) 200 MG tablet Take 400 mg by mouth as needed.     Marland Kitchen lisinopril (ZESTRIL) 20 MG tablet TAKE 1 TABLET BY MOUTH EVERY DAY 90 tablet 1  . metFORMIN (GLUCOPHAGE-XR) 500 MG 24 hr tablet TAKE 1 TABLET BY MOUTH EVERY DAY WITH BREAKFAST 90 tablet 1  . naproxen sodium (ALEVE) 220 MG tablet Take 220 mg by mouth as needed.    Marland Kitchen glucose blood (CONTOUR NEXT TEST) test strip Use as instructed. Use with breakfast and dinner. Dx code E11.9 100 each 12  . amoxicillin-clavulanate (AUGMENTIN) 875-125 MG tablet Take 1 tablet by mouth 2 (two) times daily. 14 tablet 0  . buPROPion (WELLBUTRIN SR) 150 MG 12 hr tablet Take 1 tablet (150 mg total) by mouth 2 (two) times daily. (Patient not taking: Reported on 09/04/2018) 180 tablet 1   No facility-administered medications prior to visit.     Allergies  Allergen Reactions  . Ciprofloxacin     dizziness  . Sulfa Antibiotics     rash  . Pork-Derived Products Rash    Factory raised     ROS Review of Systems  Review of Systems  Constitutional: Negative for activity change, appetite change, chills and fever.  HENT: Negative for congestion, nosebleeds, trouble swallowing and voice  change.   Respiratory: Negative for cough, shortness of breath and wheezing.   Gastrointestinal: Negative for diarrhea, nausea and vomiting.  Genitourinary: Negative for difficulty urinating, dysuria, flank pain and hematuria.  Musculoskeletal: Negative for back pain, joint swelling and neck pain. Pain in his right foot at the ball of his foot. Neurological: Negative for dizziness, speech difficulty, light-headedness and numbness.  See HPI. All other review of systems negative.     Objective:    Physical Exam  BP 140/82   Pulse 68   Temp 98.6 F (37 C) (Oral)   Resp 18   Ht 5' 10.5" (1.791 m)   Wt 263 lb (119.3 kg)   SpO2 96%   BMI 37.20 kg/m  Wt Readings from Last 3 Encounters:  03/18/19 263 lb (119.3 kg)  09/04/18 254 lb (115.2 kg)  05/31/18 253 lb 3.2 oz (114.9 kg)   Physical Exam  Constitutional: Oriented to person, place, and time. Appears well-developed and well-nourished.  HENT:  Head: Normocephalic and atraumatic.  Eyes: Conjunctivae and EOM are normal.  Cardiovascular: Normal rate, regular rhythm, normal heart sounds and intact distal pulses.  No murmur heard. Pulmonary/Chest: Effort normal and breath sounds normal. No stridor. No respiratory distress. Has no wheezes.  Neurological: Is alert and oriented to person, place, and time.  Skin: Skin is warm. Capillary refill takes less than 2 seconds.  Psychiatric: Has a normal mood and affect. Behavior is normal. Judgment and thought content normal.    Health Maintenance Due  Topic Date Due  . HIV Screening  07/31/1977  . COLONOSCOPY  07/31/2012  . HEMOGLOBIN A1C  03/06/2019    There are no preventive care reminders to display for this patient.  Lab Results  Component Value Date   TSH 1.300 11/08/2017   Lab Results  Component Value  Date   WBC 6.3 03/06/2018   HGB 15.1 03/06/2018   HCT 42.7 03/06/2018   MCV 83 03/06/2018   PLT 243 03/06/2018   Lab Results  Component Value Date   NA 136 09/04/2018   K 4.5 09/04/2018   CO2 22 09/04/2018   GLUCOSE 121 (H) 09/04/2018   BUN 13 09/04/2018   CREATININE 1.20 09/04/2018   BILITOT 0.6 09/04/2018   ALKPHOS 98 09/04/2018   AST 23 09/04/2018   ALT 50 (H) 09/04/2018   PROT 6.9 09/04/2018   ALBUMIN 4.6 09/04/2018   CALCIUM 9.8 09/04/2018   Lab Results  Component Value Date   CHOL 202 (H) 03/06/2018   Lab Results  Component Value Date   HDL 31 (L) 03/06/2018   Lab Results  Component Value Date   LDLCALC 111 (H) 03/06/2018   Lab Results  Component Value Date   TRIG 302 (H) 03/06/2018   Lab Results  Component Value Date   CHOLHDL 6.5 (H) 03/06/2018   Lab Results  Component Value Date   HGBA1C 6.3 (A) 09/04/2018      Assessment & Plan:   Problem List Items Addressed This Visit      Cardiovascular and Mediastinum   HTN (hypertension)  -  Patient's blood pressure is at goal of 139/89 or less. Condition is stable. Continue current medications and treatment plan. I recommend that you exercise for 30-45 minutes 5 days a week. I also recommend a balanced diet with fruits and vegetables every day, lean meats, and little fried foods. The DASH diet (you can find this online) is a good example of this.   Relevant Orders   Microalbumin /  creatinine urine ratio   Lipid panel     Other   Obesity (BMI 30-39.9) -      Other Visit Diagnoses    Type 2 diabetes mellitus without complication, without long-term current use of insulin ([place])    -  Primary well controlled hemoglobin a1c is at goal Continue exercise Lipids monitored and renal function in range On metformin On ace  On asa 11m Reviewed diabetic foot care Emphasized importance of eye and dental exam      Relevant Orders   Hemoglobin A1c   Comprehensive metabolic panel   Microalbumin /  creatinine urine ratio   Dyslipidemia    -  Will check levels, pt adhere to a good diet   Relevant Orders   Lipid panel      Meds ordered this encounter  Medications  . glucose blood (CONTOUR NEXT TEST) test strip    Sig: Use as instructed. Use with breakfast and dinner. Dx code E11.9    Dispense:  100 each    Refill:  12    Follow-up: No follow-ups on file.    ZForrest Moron MD

## 2019-03-18 NOTE — Patient Instructions (Signed)
° ° ° °  If you have lab work done today you will be contacted with your lab results within the next 2 weeks.  If you have not heard from us then please contact us. The fastest way to get your results is to register for My Chart. ° ° °IF you received an x-ray today, you will receive an invoice from [facility]. Please contact [facility] at [phone] with questions or concerns regarding your invoice.  ° °IF you received labwork today, you will receive an invoice from [facility]. Please contact [facility] at [phone] with questions or concerns regarding your invoice.  ° °Our billing staff will not be able to assist you with questions regarding bills from these companies. ° °You will be contacted with the lab results as soon as they are available. The fastest way to get your results is to activate your My Chart account. Instructions are located on the last page of this paperwork. If you have not heard from us regarding the results in 2 weeks, please contact this office. °  ° ° ° °

## 2019-03-19 LAB — COMPREHENSIVE METABOLIC PANEL
ALT: 33 IU/L (ref 0–44)
AST: 17 IU/L (ref 0–40)
Albumin/Globulin Ratio: 2 (ref 1.2–2.2)
Albumin: 4.3 g/dL (ref 3.8–4.9)
Alkaline Phosphatase: 80 IU/L (ref 39–117)
BUN/Creatinine Ratio: 13 (ref 9–20)
BUN: 14 mg/dL (ref 6–24)
Bilirubin Total: 0.4 mg/dL (ref 0.0–1.2)
CO2: 23 mmol/L (ref 20–29)
Calcium: 9.8 mg/dL (ref 8.7–10.2)
Chloride: 99 mmol/L (ref 96–106)
Creatinine, Ser: 1.07 mg/dL (ref 0.76–1.27)
GFR calc Af Amer: 89 mL/min/{1.73_m2} (ref >59)
GFR calc non Af Amer: 77 mL/min/{1.73_m2} (ref >59)
Globulin, Total: 2.1 g/dL (ref 1.5–4.5)
Glucose: 124 mg/dL — ABNORMAL HIGH (ref 65–99)
Potassium: 4.4 mmol/L (ref 3.5–5.2)
Sodium: 137 mmol/L (ref 134–144)
Total Protein: 6.4 g/dL (ref 6.0–8.5)

## 2019-03-19 LAB — LIPID PANEL
Chol/HDL Ratio: 3.6 ratio (ref 0.0–5.0)
Cholesterol, Total: 123 mg/dL (ref 100–199)
HDL: 34 mg/dL — ABNORMAL LOW (ref >39)
LDL Calculated: 70 mg/dL (ref 0–99)
Triglycerides: 96 mg/dL (ref 0–149)
VLDL Cholesterol Cal: 19 mg/dL (ref 5–40)

## 2019-03-19 LAB — HEMOGLOBIN A1C
Est. average glucose Bld gHb Est-mCnc: 140 mg/dL
Hgb A1c MFr Bld: 6.5 % — ABNORMAL HIGH (ref 4.8–5.6)

## 2019-03-19 LAB — MICROALBUMIN / CREATININE URINE RATIO
Creatinine, Urine: 102.8 mg/dL
Microalb/Creat Ratio: 4 mg/g creat (ref 0–29)
Microalbumin, Urine: 3.7 ug/mL

## 2019-03-27 ENCOUNTER — Encounter: Payer: Self-pay | Admitting: Family Medicine

## 2019-03-29 ENCOUNTER — Other Ambulatory Visit: Payer: Self-pay | Admitting: Family Medicine

## 2019-05-03 ENCOUNTER — Telehealth: Payer: Self-pay | Admitting: Physician Assistant

## 2019-05-03 DIAGNOSIS — J0110 Acute frontal sinusitis, unspecified: Secondary | ICD-10-CM

## 2019-05-03 MED ORDER — AMOXICILLIN-POT CLAVULANATE 875-125 MG PO TABS: 1.0000 | ORAL_TABLET | Freq: Two times a day (BID) | ORAL | 0 refills | Status: DC

## 2019-05-03 NOTE — Progress Notes (Addendum)

## 2019-05-21 ENCOUNTER — Encounter: Payer: Self-pay | Admitting: Family Medicine

## 2019-05-23 ENCOUNTER — Other Ambulatory Visit: Payer: Self-pay

## 2019-05-23 ENCOUNTER — Ambulatory Visit (INDEPENDENT_AMBULATORY_CARE_PROVIDER_SITE_OTHER): Payer: No Typology Code available for payment source | Admitting: Family Medicine

## 2019-05-23 DIAGNOSIS — Z23 Encounter for immunization: Secondary | ICD-10-CM

## 2019-08-30 ENCOUNTER — Other Ambulatory Visit: Payer: Self-pay | Admitting: Family Medicine

## 2019-08-30 ENCOUNTER — Other Ambulatory Visit: Payer: Self-pay | Admitting: Emergency Medicine

## 2019-08-30 DIAGNOSIS — I10 Essential (primary) hypertension: Secondary | ICD-10-CM

## 2019-08-30 DIAGNOSIS — N529 Male erectile dysfunction, unspecified: Secondary | ICD-10-CM

## 2019-08-30 MED ORDER — LISINOPRIL 20 MG PO TABS: 20.0000 mg | ORAL_TABLET | Freq: Every day | ORAL | 0 refills | Status: AC

## 2019-08-30 NOTE — Telephone Encounter (Signed)
Forwarding medication refill request to the clinical pool for review. 

## 2019-08-30 NOTE — Telephone Encounter (Signed)
Please schedule patient a 6 month f/u appt with Dr Nolon Rod

## 2019-09-02 NOTE — Telephone Encounter (Signed)
Pt switched to [facility]. Does not need refill or ov

## 2019-09-24 ENCOUNTER — Other Ambulatory Visit: Payer: Self-pay | Admitting: Family Medicine

## 2019-09-24 DIAGNOSIS — N529 Male erectile dysfunction, unspecified: Secondary | ICD-10-CM

## 2019-09-24 DIAGNOSIS — I10 Essential (primary) hypertension: Secondary | ICD-10-CM

## 2019-11-19 LAB — COLOGUARD: COLOGUARD: NEGATIVE

## 2023-07-27 LAB — COLOGUARD: COLOGUARD: NEGATIVE

## 2024-03-20 LAB — COMPREHENSIVE METABOLIC PANEL WITH GFR: eGFR: NEGATIVE

## 2024-03-20 LAB — PROTEIN / CREATININE RATIO, URINE
Albumin, U: NEGATIVE
Creatinine, Urine: NEGATIVE

## 2024-03-20 LAB — MICROALBUMIN, URINE: Microalb, Ur: NEGATIVE

## 2024-03-20 LAB — MICROALBUMIN / CREATININE URINE RATIO: Microalb Creat Ratio: NEGATIVE

## 2024-05-14 ENCOUNTER — Ambulatory Visit: Payer: Self-pay | Admitting: Family Medicine

## 2024-05-23 ENCOUNTER — Ambulatory Visit (INDEPENDENT_AMBULATORY_CARE_PROVIDER_SITE_OTHER): Payer: Self-pay | Admitting: Family Medicine

## 2024-05-23 ENCOUNTER — Encounter: Payer: Self-pay | Admitting: Family Medicine

## 2024-05-23 VITALS — BP 142/86 | HR 76 | Temp 98.5°F | Ht 70.5 in | Wt 261.0 lb

## 2024-05-23 DIAGNOSIS — Z23 Encounter for immunization: Secondary | ICD-10-CM | POA: Diagnosis not present

## 2024-05-23 DIAGNOSIS — E785 Hyperlipidemia, unspecified: Secondary | ICD-10-CM | POA: Diagnosis not present

## 2024-05-23 DIAGNOSIS — Z7689 Persons encountering health services in other specified circumstances: Secondary | ICD-10-CM | POA: Diagnosis not present

## 2024-05-23 DIAGNOSIS — Z7985 Long-term (current) use of injectable non-insulin antidiabetic drugs: Secondary | ICD-10-CM

## 2024-05-23 DIAGNOSIS — I10 Essential (primary) hypertension: Secondary | ICD-10-CM | POA: Diagnosis not present

## 2024-05-23 DIAGNOSIS — E1165 Type 2 diabetes mellitus with hyperglycemia: Secondary | ICD-10-CM | POA: Diagnosis not present

## 2024-05-23 MED ORDER — COVID-19 MRNA VACC (MODERNA) 50 MCG/0.5ML IM SUSP: 0.5000 mL | Freq: Once | INTRAMUSCULAR | 0 refills | Status: AC

## 2024-05-23 NOTE — Patient Instructions (Addendum)
-  It was nice to meet you and look forward to taking care of you. -Continue all medications. Discussed about adding Farxiga or Jardiance, but would like to hold off to see next A1c in 2 months.  -Recommend to work on diet modifications and start participating in exercise as tolerated.  -Send Covid vaccine to pharmacy.  -Influenza vaccine provided today.  -Follow up in 2 months and please be fasting.

## 2024-05-23 NOTE — Assessment & Plan Note (Signed)
 Unable to tolerate a statin. Taking Red Yeast Rice. Encouraged lifestyle modifications.

## 2024-05-23 NOTE — Progress Notes (Signed)
 New Patient Office Visit  Subjective   Patient ID: Sirus Labrie, male    DOB: [birth year]  Age: 63 y.o. MRN: [id number]  CC:  Chief Complaint  Patient presents with   Establish Care    HPI Malone Vanblarcom presents to establish care with new provider.   Patients previous primary care provider: [facility] with Christopher Bohr, NP. Last seen 03/20/2024.   Specialist: None   DM2: Chronic. Patient is taking Dulaglutide 0.75mg  injection weekly. Monitors blood sugar sometimes. He reports he just dropped down on dose at his last PCP visit in July, Dulaglutide 1.5mg  to 0.75mg  because of vomiting and diarrhea. Medications he has previously tried: Mounjaro-failed due to severe vomiting; Metformin -failed GI symptoms. He reports he thinks he was on a SGLT-2, thought he had side effects from that too, but was not eating correctly. He reports he needs to improve his diet and not exercising.  -Last A1c on 07/09 was 8.6.   HTN: Chronic. Patient is taking Lisinopril  20mg  daily. Monitors blood pressure. Rarely has blood pressure over 140/90. Does not report any symptoms.   Hyperlipidemia: Unable to tolerate a statin. Takes Red Yeast Rice. He reports his lipids changing.  Last lipid levels with [facility] on 07/09 was:  Total Cholesterol 183 Triglycerides: 102 HDL: 34  LDL: 130   Outpatient Encounter Medications as of 05/23/2024  Medication Sig   Blood Pressure Monitoring (BLOOD PRESSURE KIT) DEVI 1 Device by Does not apply route daily.   COVID-19 mRNA vaccine, Moderna, >/= 54yrs, (SPIKEVAX) injection Inject 0.5 mLs into the muscle once for 1 dose.   Dulaglutide 0.75 MG/0.5ML SOAJ Inject 0.75 mg into the skin.   glucose blood (CONTOUR NEXT TEST) test strip Use as instructed. Use with breakfast and dinner. Dx code E11.9   ibuprofen (ADVIL,MOTRIN) 200 MG tablet Take 400 mg by mouth as needed.    lisinopril  (ZESTRIL ) 20 MG tablet Take 1 tablet (20 mg total) by mouth  daily.   Red Yeast Rice Extract (RED YEAST RICE PO) Take 1 tablet by mouth daily.   [DISCONTINUED] naproxen sodium (ALEVE) 220 MG tablet Take 220 mg by mouth as needed.   [DISCONTINUED] amoxicillin -clavulanate (AUGMENTIN ) 875-125 MG tablet Take 1 tablet by mouth 2 (two) times daily.   [DISCONTINUED] atorvastatin  (LIPITOR) 20 MG tablet TAKE 1 TABLET BY MOUTH EVERY DAY   [DISCONTINUED] diclofenac  sodium (VOLTAREN ) 1 % GEL Apply 2 g topically 4 (four) times daily.   [DISCONTINUED] lisinopril  (ZESTRIL ) 20 MG tablet TAKE 1 TABLET BY MOUTH EVERY DAY   [DISCONTINUED] metFORMIN  (GLUCOPHAGE -XR) 500 MG 24 hr tablet TAKE 1 TABLET BY MOUTH EVERY DAY WITH BREAKFAST   No facility-administered encounter medications on file as of 05/23/2024.    Past Medical History:  Diagnosis Date   Alcoholic (HCC)    binge   Allergy    Anxiety    Depression    Diabetes mellitus without complication (HCC)    Diverticulitis    GERD (gastroesophageal reflux disease)    Hyperlipidemia    Hypertension    IBS (irritable bowel syndrome)    Trigger index finger of left hand    Trigger little finger of right hand     Past Surgical History:  Procedure Laterality Date   INGUINAL HERNIA REPAIR     age 64    Family History  Problem Relation Age of Onset   Heart disease Father    Multiple myeloma Father    Heart attack Father  Alcohol abuse Father    Cancer Father    Heart disease Brother        MI   Diabetes Brother    Other Brother        precancerous esophageal cells   Breast cancer Mother        mets to lung and bone   Heart attack Mother    Cancer Mother    Heart disease Maternal Grandmother    Arthritis Maternal Grandmother    Cancer Maternal Grandmother    Heart disease Maternal Grandfather    Prostate cancer Maternal Grandfather    Stomach cancer Paternal Grandfather 75   Skin cancer Sister    Alcohol abuse Sister    Cancer Sister    Cancer Paternal Grandmother    Colon cancer Neg Hx     Colon polyps Neg Hx    Esophageal cancer Neg Hx    Rectal cancer Neg Hx     Social History   Socioeconomic History   Marital status: Married    Spouse name: Not on file   Number of children: 1   Years of education: Not on file   Highest education level: Some college, no degree  Occupational History   Occupation: Catering manager: [employer]  Tobacco Use   Smoking status: Former    Current packs/day: 0.00    Average packs/day: 2.0 packs/day for 10.0 years (20.0 ttl pk-yrs)    Types: Cigarettes    Quit date: 02/02/2000    Years since quitting: 24.3   Smokeless tobacco: Never   Tobacco comments:    quit 18 years ago  Vaping Use   Vaping status: Never Used  Substance and Sexual Activity   Alcohol use: No   Drug use: No   Sexual activity: Yes    Birth control/protection: Condom    Comment: number of sex partners in the last 12 months 1  Other Topics Concern   Not on file  Social History Narrative   Not on file   Social Drivers of Health   Financial Resource Strain: Low Risk  (05/19/2024)   Overall Financial Resource Strain (CARDIA)    Difficulty of Paying Living Expenses: Not very hard  Food Insecurity: No Food Insecurity (05/19/2024)   Hunger Vital Sign    Worried About Running Out of Food in the Last Year: Never true    Ran Out of Food in the Last Year: Never true  Transportation Needs: No Transportation Needs (05/19/2024)   PRAPARE - Administrator, Civil Service (Medical): No    Lack of Transportation (Non-Medical): No  Physical Activity: Insufficiently Active (05/19/2024)   Exercise Vital Sign    Days of Exercise per Week: 2 days    Minutes of Exercise per Session: 40 min  Stress: Stress Concern Present (05/19/2024)   [employer] of Occupational Health - Occupational Stress Questionnaire    Feeling of Stress: To some extent  Social Connections: Socially Isolated (05/19/2024)   Social Connection and Isolation Panel     Frequency of Communication with Friends and Family: Never    Frequency of Social Gatherings with Friends and Family: Never    Attends Religious Services: Never    Database administrator or Organizations: No    Attends Banker Meetings: Not on file    Marital Status: Married  Intimate Partner Violence: Not At Risk (10/27/2023)   Received from [facility]   HITS    Over the  last 12 months how often did your partner physically hurt you?: Never    Over the last 12 months how often did your partner insult you or talk down to you?: Never    Over the last 12 months how often did your partner threaten you with physical harm?: Never    Over the last 12 months how often did your partner scream or curse at you?: Never    ROS See HPI above    Objective  BP (!) 142/86   Pulse 76   Temp 98.5 F (36.9 C) (Oral)   Ht 5' 10.5 (1.791 m)   Wt 261 lb (118.4 kg)   SpO2 95%   BMI 36.92 kg/m   Physical Exam Vitals reviewed.  Constitutional:      General: He is not in acute distress.    Appearance: Normal appearance. He is obese. He is not ill-appearing, toxic-appearing or diaphoretic.  HENT:     Head: Normocephalic and atraumatic.  Eyes:     General:        Right eye: No discharge.        Left eye: No discharge.     Conjunctiva/sclera: Conjunctivae normal.  Cardiovascular:     Rate and Rhythm: Normal rate and regular rhythm.     Heart sounds: Normal heart sounds. No murmur heard.    No friction rub. No gallop.  Pulmonary:     Effort: Pulmonary effort is normal. No respiratory distress.     Breath sounds: Normal breath sounds.  Musculoskeletal:        General: Normal range of motion.  Skin:    General: Skin is warm and dry.  Neurological:     General: No focal deficit present.     Mental Status: He is alert and oriented to person, place, and time. Mental status is at baseline.  Psychiatric:        Mood and Affect: Mood normal.        Behavior: Behavior normal.         Thought Content: Thought content normal.        Judgment: Judgment normal.      Assessment & Plan:  Primary hypertension Assessment & Plan: Blood pressure was slightly elevated today. Patient had just took his medication prior to visit. Will reassess BP in 2 months. Continue taking Lisinopril  20mg  daily.    Hyperlipidemia, unspecified hyperlipidemia type Assessment & Plan: Unable to tolerate a statin. Taking Red Yeast Rice. Encouraged lifestyle modifications.    Type 2 diabetes mellitus with hyperglycemia, without long-term current use of insulin (HCC) Assessment & Plan: Uncontrolled based on previous A1c. Continue Dulaglutide 0.75mg  injection weekly. Discussed about adding a SGLT-2, since A1c is uncontrolled, decrease in dose of medication, patient voices that he is not eating, nor participating in regular exercise. However, he would like to work on lifestyle modifications and continue same dose of Trulicity without changing medication. Patient is on an ACE, but unable to tolerate a statin. Requesting records from most recent ophthalmology exam. Foot exam, CMP, Microalbumin/creatinine urine were obtained and completed on previous PCP visit on 07/09.    Immunization due -     COVID-19 mRNA Vacc (Moderna); Inject 0.5 mLs into the muscle once for 1 dose.  Dispense: 0.5 mL; Refill: 0 -     Flu vaccine trivalent PF, 6mos and older(Flulaval,Afluria,Fluarix,Fluzone)  Encounter to establish care  1.Review health maintenance:  -Covid booster: Sent prescription for co-morbidities of diabetes, obesity.  -Influenza vaccine: Administered -HIV screening: Previously  had it completed in 1990s, declines today -Ophthalmology eye exam: [facility]  Return in about 2 months (around 07/23/2024) for chronic management.   Eber Ferrufino, NP

## 2024-05-23 NOTE — Assessment & Plan Note (Signed)
 Uncontrolled based on previous A1c. Continue Dulaglutide 0.75mg  injection weekly. Discussed about adding a SGLT-2, since A1c is uncontrolled, decrease in dose of medication, patient voices that he is not eating, nor participating in regular exercise. However, he would like to work on lifestyle modifications and continue same dose of Trulicity without changing medication. Patient is on an ACE, but unable to tolerate a statin. Requesting records from most recent ophthalmology exam. Foot exam, CMP, Microalbumin/creatinine urine were obtained and completed on previous PCP visit on 07/09.

## 2024-05-23 NOTE — Assessment & Plan Note (Signed)
 Blood pressure was slightly elevated today. Patient had just took his medication prior to visit. Will reassess BP in 2 months. Continue taking Lisinopril  20mg  daily.

## 2024-07-23 ENCOUNTER — Ambulatory Visit: Admitting: Family Medicine

## 2024-07-23 ENCOUNTER — Encounter: Payer: Self-pay | Admitting: Family Medicine

## 2024-07-23 ENCOUNTER — Ambulatory Visit: Payer: Self-pay | Admitting: Family Medicine

## 2024-07-23 VITALS — BP 130/80 | HR 76 | Temp 97.8°F | Ht 70.5 in | Wt 259.0 lb

## 2024-07-23 DIAGNOSIS — R748 Abnormal levels of other serum enzymes: Secondary | ICD-10-CM

## 2024-07-23 DIAGNOSIS — E785 Hyperlipidemia, unspecified: Secondary | ICD-10-CM

## 2024-07-23 DIAGNOSIS — I10 Essential (primary) hypertension: Secondary | ICD-10-CM | POA: Diagnosis not present

## 2024-07-23 DIAGNOSIS — Z7985 Long-term (current) use of injectable non-insulin antidiabetic drugs: Secondary | ICD-10-CM

## 2024-07-23 DIAGNOSIS — E1165 Type 2 diabetes mellitus with hyperglycemia: Secondary | ICD-10-CM | POA: Diagnosis not present

## 2024-07-23 LAB — CBC WITH DIFFERENTIAL/PLATELET
Basophils Absolute: 0.1 K/uL (ref 0.0–0.1)
Basophils Relative: 0.9 % (ref 0.0–3.0)
Eosinophils Absolute: 0.2 K/uL (ref 0.0–0.7)
Eosinophils Relative: 3.3 % (ref 0.0–5.0)
HCT: 43.4 % (ref 39.0–52.0)
Hemoglobin: 15.4 g/dL (ref 13.0–17.0)
Lymphocytes Relative: 25 % (ref 12.0–46.0)
Lymphs Abs: 1.4 K/uL (ref 0.7–4.0)
MCHC: 35.5 g/dL (ref 30.0–36.0)
MCV: 83.2 fl (ref 78.0–100.0)
Monocytes Absolute: 0.3 K/uL (ref 0.1–1.0)
Monocytes Relative: 6.1 % (ref 3.0–12.0)
Neutro Abs: 3.5 K/uL (ref 1.4–7.7)
Neutrophils Relative %: 64.7 % (ref 43.0–77.0)
Platelets: 224 K/uL (ref 150.0–400.0)
RBC: 5.21 Mil/uL (ref 4.22–5.81)
RDW: 13.7 % (ref 11.5–15.5)
WBC: 5.5 K/uL (ref 4.0–10.5)

## 2024-07-23 LAB — COMPREHENSIVE METABOLIC PANEL WITH GFR
ALT: 80 U/L — ABNORMAL HIGH (ref 0–53)
AST: 23 U/L (ref 0–37)
Albumin: 4.3 g/dL (ref 3.5–5.2)
Alkaline Phosphatase: 125 U/L — ABNORMAL HIGH (ref 39–117)
BUN: 14 mg/dL (ref 6–23)
CO2: 26 meq/L (ref 19–32)
Calcium: 9.1 mg/dL (ref 8.4–10.5)
Chloride: 99 meq/L (ref 96–112)
Creatinine, Ser: 1.04 mg/dL (ref 0.40–1.50)
GFR: 77.24 mL/min (ref >60.00)
Glucose, Bld: 239 mg/dL — ABNORMAL HIGH (ref 70–99)
Potassium: 3.9 meq/L (ref 3.5–5.1)
Sodium: 134 meq/L — ABNORMAL LOW (ref 135–145)
Total Bilirubin: 0.7 mg/dL (ref 0.2–1.2)
Total Protein: 7 g/dL (ref 6.0–8.3)

## 2024-07-23 LAB — LIPID PANEL
Cholesterol: 194 mg/dL (ref 0–200)
HDL: 37.2 mg/dL — ABNORMAL LOW (ref >39.00)
LDL Cholesterol: 111 mg/dL — ABNORMAL HIGH (ref 0–99)
NonHDL: 156.42
Total CHOL/HDL Ratio: 5
Triglycerides: 229 mg/dL — ABNORMAL HIGH (ref 0.0–149.0)
VLDL: 45.8 mg/dL — ABNORMAL HIGH (ref 0.0–40.0)

## 2024-07-23 LAB — HEMOGLOBIN A1C: Hgb A1c MFr Bld: 10 % — ABNORMAL HIGH (ref 4.6–6.5)

## 2024-07-23 MED ORDER — EMPAGLIFLOZIN 25 MG PO TABS: 25.0000 mg | ORAL_TABLET | Freq: Every day | ORAL | 0 refills | Status: AC

## 2024-07-23 MED ORDER — FENOFIBRATE 145 MG PO TABS: 145.0000 mg | ORAL_TABLET | Freq: Every day | ORAL | 0 refills | Status: AC

## 2024-07-23 NOTE — Addendum Note (Signed)
 Addended by: ELNER NANNY B on: 07/23/2024 02:31 PM   Modules accepted: Orders

## 2024-07-23 NOTE — Assessment & Plan Note (Signed)
 Unable to tolerate a statin. Taking Red Yeast Rice. Ordered lipid panel, CMP, and CBC.

## 2024-07-23 NOTE — Assessment & Plan Note (Signed)
 Uncontrolled based on previous A1c. Continue Dulaglutide 0.75mg  injection weekly. Patient is on an ACE, but unable to tolerate a statin. Requesting records from most recent ophthalmology exam. Foot exam, CMP, Microalbumin/creatinine urine were obtained and completed on previous PCP visit on 07/09. Ordered CBC, CMP, and A1c.

## 2024-07-23 NOTE — Patient Instructions (Addendum)
-  It was a pleasure to see you today.  -Continue all medications. -Ordered labs. Office will call with lab results and will be available via MyChart.  -Blood pressure is slightly elevated. Could be from missing morning dose. Recommend to monitor blood pressure, follow up sooner if consistently above 130/80.  -Follow up in 3 months.

## 2024-07-23 NOTE — Assessment & Plan Note (Signed)
 Blood pressure was slightly elevated today. Patient didn't take Lisinopril  before appointment. Continue taking Lisinopril  20mg  daily. Recommend to monitor his blood pressure, if it is consistently above 130/80, will need to follow up.

## 2024-07-23 NOTE — Progress Notes (Signed)
 Established Patient Office Visit   Subjective:  Patient ID: Kyle Price, male    DOB: 02-07-1961  Age: 63 y.o. MRN: [id number]  Chief Complaint  Patient presents with   Medical Management of Chronic Issues    2 month follow up     HPI DM2: Chronic. Patient is prescribed Dulaglutide 0.75mg  injection weekly. He was previously taking 1.5mg  weekly injection, but was having complications with taking the high dose. He reports he had one 1.5mg  in the refrigerator that he thought was 0.75mg . Injected with it once and caused a lot of GI upset. He didn't inject again for about 10 days. Since, he has had 2 0.75mg  injections. He is able to tolerate it better than 1.5mg . Last A1c was 8.6 on 07/09    HTN: Chronic. Patient is taking Lisinopril  20mg  daily. Monitors blood pressure. Rarely has blood pressure over 140/90. Does not report any symptoms. Not took his morning dose.    BP Readings from Last 3 Encounters:  07/23/24 130/80  05/23/24 (!) 142/86  03/18/19 140/82   Hyperlipidemia: Unable to tolerate a statin. Takes Red Yeast Rice. He reports his lipids changing. Last lipid levels with [facility] on 07/09 was:  Total Cholesterol 183 Triglycerides: 102 HDL: 34  LDL: 130  ROS See HPI above     Objective:   BP 130/80   Pulse 76   Temp 97.8 F (36.6 C) (Oral)   Ht 5' 10.5 (1.791 m)   Wt 259 lb (117.5 kg)   SpO2 97%   BMI 36.64 kg/m    Physical Exam Vitals reviewed.  Constitutional:      General: He is not in acute distress.    Appearance: Normal appearance. He is obese. He is not ill-appearing, toxic-appearing or diaphoretic.  HENT:     Head: Normocephalic and atraumatic.  Eyes:     General:        Right eye: No discharge.        Left eye: No discharge.     Conjunctiva/sclera: Conjunctivae normal.  Cardiovascular:     Rate and Rhythm: Normal rate and regular rhythm.     Heart sounds: Normal heart sounds. No murmur heard.    No friction rub. No gallop.  Pulmonary:      Effort: Pulmonary effort is normal. No respiratory distress.     Breath sounds: Normal breath sounds.  Musculoskeletal:        General: Normal range of motion.  Skin:    General: Skin is warm and dry.  Neurological:     General: No focal deficit present.     Mental Status: He is alert and oriented to person, place, and time. Mental status is at baseline.  Psychiatric:        Mood and Affect: Mood normal.        Behavior: Behavior normal.        Thought Content: Thought content normal.        Judgment: Judgment normal.      Assessment & Plan:  Primary hypertension Assessment & Plan: Blood pressure was slightly elevated today. Patient didn't take Lisinopril  before appointment. Continue taking Lisinopril  20mg  daily. Recommend to monitor his blood pressure, if it is consistently above 130/80, will need to follow up.     Orders: -     CBC with Differential/Platelet -     Comprehensive metabolic panel with GFR  Hyperlipidemia, unspecified hyperlipidemia type Assessment & Plan: Unable to tolerate a statin. Taking Red Yeast Rice. Ordered lipid  panel, CMP, and CBC.     Orders: -     CBC with Differential/Platelet -     Comprehensive metabolic panel with GFR -     Lipid panel  Type 2 diabetes mellitus with hyperglycemia, without long-term current use of insulin (HCC) Assessment & Plan: Uncontrolled based on previous A1c. Continue Dulaglutide 0.75mg  injection weekly. Patient is on an ACE, but unable to tolerate a statin. Requesting records from most recent ophthalmology exam. Foot exam, CMP, Microalbumin/creatinine urine were obtained and completed on previous PCP visit on 07/09. Ordered CBC, CMP, and A1c.     Orders: -     CBC with Differential/Platelet -     Comprehensive metabolic panel with GFR -     Hemoglobin A1c  1.Review health maintenance: -Covid booster: May obtain at local pharmacy  -HIV screening: Declines   Return in about 3 months (around 10/23/2024) for  chronic management.   Evertt Chouinard, NP

## 2024-07-24 ENCOUNTER — Other Ambulatory Visit (HOSPITAL_COMMUNITY): Payer: Self-pay

## 2024-07-24 ENCOUNTER — Telehealth: Payer: Self-pay

## 2024-07-24 NOTE — Telephone Encounter (Signed)
 Pharmacy Patient Advocate Encounter  Received notification from [facility] that Prior Authorization for Jardiance 25 has been APPROVED from 07/24/24 to 07/24/25. Ran test claim, Copay is $0.00. This test claim was processed through [facility]- copay amounts may vary at other pharmacies due to pharmacy/plan contracts, or as the patient moves through the different stages of their insurance plan.   PA #/Case ID/Reference #: # [id number]

## 2024-07-24 NOTE — Telephone Encounter (Signed)
 Pharmacy Patient Advocate Encounter   Received notification from Onbase that prior authorization for Jardiance 25 is required/requested.   Insurance verification completed.   The patient is insured through [facility].   Per test claim: PA required; PA submitted to above mentioned insurance via Latent Key/confirmation #/EOC [id number] Status is pending

## 2024-07-25 ENCOUNTER — Other Ambulatory Visit (HOSPITAL_COMMUNITY): Payer: Self-pay

## 2024-08-05 ENCOUNTER — Ambulatory Visit (HOSPITAL_BASED_OUTPATIENT_CLINIC_OR_DEPARTMENT_OTHER)

## 2024-08-15 ENCOUNTER — Encounter: Payer: Self-pay | Admitting: Family Medicine

## 2024-10-23 ENCOUNTER — Ambulatory Visit: Admitting: Family Medicine
